# Patient Record
Sex: Male | Born: 1950 | Race: White | Hispanic: No | Smoking: Never smoker
Health system: Southern US, Community
[De-identification: ages and names within clinical notes are randomized; demographics above are authoritative.]

## PROBLEM LIST (undated history)

## (undated) DIAGNOSIS — K5790 Diverticulosis of intestine, part unspecified, without perforation or abscess without bleeding: Secondary | ICD-10-CM

## (undated) DIAGNOSIS — K432 Incisional hernia without obstruction or gangrene: Secondary | ICD-10-CM

## (undated) DIAGNOSIS — I10 Essential (primary) hypertension: Secondary | ICD-10-CM

## (undated) DIAGNOSIS — Z973 Presence of spectacles and contact lenses: Secondary | ICD-10-CM

## (undated) DIAGNOSIS — E785 Hyperlipidemia, unspecified: Secondary | ICD-10-CM

## (undated) DIAGNOSIS — R21 Rash and other nonspecific skin eruption: Secondary | ICD-10-CM

## (undated) DIAGNOSIS — M1990 Unspecified osteoarthritis, unspecified site: Secondary | ICD-10-CM

## (undated) DIAGNOSIS — K219 Gastro-esophageal reflux disease without esophagitis: Secondary | ICD-10-CM

## (undated) DIAGNOSIS — Z5189 Encounter for other specified aftercare: Secondary | ICD-10-CM

## (undated) DIAGNOSIS — J189 Pneumonia, unspecified organism: Secondary | ICD-10-CM

## (undated) DIAGNOSIS — K259 Gastric ulcer, unspecified as acute or chronic, without hemorrhage or perforation: Secondary | ICD-10-CM

## (undated) HISTORY — DX: Unspecified osteoarthritis, unspecified site: M19.90

## (undated) HISTORY — PX: COLONOSCOPY WITH ESOPHAGOGASTRODUODENOSCOPY (EGD): SHX5779

## (undated) HISTORY — DX: Hyperlipidemia, unspecified: E78.5

## (undated) HISTORY — DX: Essential (primary) hypertension: I10

## (undated) HISTORY — PX: SHOULDER SURGERY: SHX246

## (undated) HISTORY — PX: BACK SURGERY: SHX140

## (undated) HISTORY — DX: Encounter for other specified aftercare: Z51.89

---
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
## 1978-02-21 HISTORY — PX: COLON SURGERY: SHX602

## 1986-02-21 HISTORY — PX: HERNIA REPAIR: SHX51

## 2000-03-21 ENCOUNTER — Ambulatory Visit (HOSPITAL_COMMUNITY): Admission: RE | Admit: 2000-03-21 | Discharge: 2000-03-21 | Payer: Self-pay | Admitting: Neurosurgery

## 2000-03-21 ENCOUNTER — Encounter: Payer: Self-pay | Admitting: Neurosurgery

## 2000-04-12 ENCOUNTER — Encounter: Payer: Self-pay | Admitting: Neurosurgery

## 2000-04-12 ENCOUNTER — Ambulatory Visit (HOSPITAL_COMMUNITY): Admission: RE | Admit: 2000-04-12 | Discharge: 2000-04-13 | Payer: Self-pay | Admitting: Neurosurgery

## 2000-05-25 ENCOUNTER — Ambulatory Visit (HOSPITAL_COMMUNITY): Admission: RE | Admit: 2000-05-25 | Discharge: 2000-05-25 | Payer: Self-pay | Admitting: Neurosurgery

## 2000-05-25 ENCOUNTER — Encounter: Payer: Self-pay | Admitting: Neurosurgery

## 2000-07-07 ENCOUNTER — Ambulatory Visit (HOSPITAL_COMMUNITY): Admission: RE | Admit: 2000-07-07 | Discharge: 2000-07-07 | Payer: Self-pay | Admitting: Neurosurgery

## 2000-07-07 ENCOUNTER — Encounter: Payer: Self-pay | Admitting: Neurosurgery

## 2002-10-04 ENCOUNTER — Encounter: Payer: Self-pay | Admitting: Specialist

## 2002-10-04 ENCOUNTER — Encounter (INDEPENDENT_AMBULATORY_CARE_PROVIDER_SITE_OTHER): Payer: Self-pay | Admitting: Specialist

## 2002-10-04 ENCOUNTER — Observation Stay (HOSPITAL_COMMUNITY): Admission: RE | Admit: 2002-10-04 | Discharge: 2002-10-05 | Payer: Self-pay | Admitting: Specialist

## 2004-12-16 ENCOUNTER — Ambulatory Visit (HOSPITAL_COMMUNITY): Admission: RE | Admit: 2004-12-16 | Discharge: 2004-12-16 | Payer: Self-pay | Admitting: Gastroenterology

## 2006-03-22 ENCOUNTER — Encounter: Admission: RE | Admit: 2006-03-22 | Discharge: 2006-03-22 | Payer: Self-pay | Admitting: Specialist

## 2006-04-18 ENCOUNTER — Encounter: Admission: RE | Admit: 2006-04-18 | Discharge: 2006-04-18 | Payer: Self-pay | Admitting: Specialist

## 2006-07-20 ENCOUNTER — Inpatient Hospital Stay (HOSPITAL_COMMUNITY): Admission: RE | Admit: 2006-07-20 | Discharge: 2006-07-24 | Payer: Self-pay | Admitting: Specialist

## 2007-09-18 ENCOUNTER — Encounter: Admission: RE | Admit: 2007-09-18 | Discharge: 2007-09-18 | Payer: Self-pay | Admitting: General Surgery

## 2008-02-22 HISTORY — PX: HERNIA REPAIR: SHX51

## 2010-04-21 ENCOUNTER — Other Ambulatory Visit (HOSPITAL_BASED_OUTPATIENT_CLINIC_OR_DEPARTMENT_OTHER): Payer: Self-pay | Admitting: Family Medicine

## 2010-04-22 ENCOUNTER — Ambulatory Visit (HOSPITAL_BASED_OUTPATIENT_CLINIC_OR_DEPARTMENT_OTHER)
Admission: RE | Admit: 2010-04-22 | Discharge: 2010-04-22 | Disposition: A | Discharge status: Home / Self Care | Payer: 59 | Source: Ambulatory Visit | Attending: Family Medicine | Admitting: Family Medicine

## 2010-04-22 DIAGNOSIS — K5730 Diverticulosis of large intestine without perforation or abscess without bleeding: Secondary | ICD-10-CM | POA: Insufficient documentation

## 2010-04-22 DIAGNOSIS — K7689 Other specified diseases of liver: Secondary | ICD-10-CM | POA: Insufficient documentation

## 2010-04-22 DIAGNOSIS — R109 Unspecified abdominal pain: Secondary | ICD-10-CM

## 2010-06-03 ENCOUNTER — Encounter (HOSPITAL_COMMUNITY): Payer: 59

## 2010-06-03 ENCOUNTER — Other Ambulatory Visit (HOSPITAL_COMMUNITY): Payer: Self-pay | Admitting: Surgery

## 2010-06-03 ENCOUNTER — Ambulatory Visit (HOSPITAL_COMMUNITY)
Admission: RE | Admit: 2010-06-03 | Discharge: 2010-06-03 | Disposition: A | Discharge status: Home / Self Care | Payer: 59 | Source: Ambulatory Visit | Attending: Surgery | Admitting: Surgery

## 2010-06-03 ENCOUNTER — Other Ambulatory Visit: Payer: Self-pay | Admitting: Surgery

## 2010-06-03 DIAGNOSIS — Z01818 Encounter for other preprocedural examination: Secondary | ICD-10-CM | POA: Insufficient documentation

## 2010-06-03 DIAGNOSIS — I10 Essential (primary) hypertension: Secondary | ICD-10-CM

## 2010-06-03 DIAGNOSIS — Z79899 Other long term (current) drug therapy: Secondary | ICD-10-CM | POA: Insufficient documentation

## 2010-06-03 LAB — CBC
Hemoglobin: 14.6 g/dL (ref 13.0–17.0)
MCHC: 34 g/dL (ref 30.0–36.0)
MCV: 87 fL (ref 78.0–100.0)
Platelets: 251 10*3/uL (ref 150–400)
WBC: 4.3 10*3/uL (ref 4.0–10.5)

## 2010-06-03 LAB — BASIC METABOLIC PANEL
BUN: 20 mg/dL (ref 6–23)
CO2: 27 mEq/L (ref 19–32)
Calcium: 9.7 mg/dL (ref 8.4–10.5)
GFR calc non Af Amer: >60 mL/min (ref >60)
Glucose, Bld: 96 mg/dL (ref 70–99)
Sodium: 142 mEq/L (ref 135–145)

## 2010-06-03 LAB — SURGICAL PCR SCREEN: MRSA, PCR: NEGATIVE

## 2010-06-09 ENCOUNTER — Inpatient Hospital Stay (HOSPITAL_COMMUNITY)
Admission: RE | Admit: 2010-06-09 | Discharge: 2010-06-13 | DRG: 331 | Disposition: A | Discharge status: Home / Self Care | Payer: 59 | Source: Ambulatory Visit | Attending: Surgery | Admitting: Surgery

## 2010-06-09 ENCOUNTER — Other Ambulatory Visit: Payer: Self-pay | Admitting: Surgery

## 2010-06-09 DIAGNOSIS — D175 Benign lipomatous neoplasm of intra-abdominal organs: Principal | ICD-10-CM | POA: Diagnosis present

## 2010-06-09 LAB — TYPE AND SCREEN

## 2010-06-09 LAB — ABO/RH: ABO/RH(D): A POS

## 2010-06-10 LAB — BASIC METABOLIC PANEL
BUN: 15 mg/dL (ref 6–23)
GFR calc non Af Amer: >60 mL/min (ref >60)
Glucose, Bld: 81 mg/dL (ref 70–99)
Potassium: 4 mEq/L (ref 3.5–5.1)

## 2010-06-10 LAB — CBC
Hemoglobin: 12.1 g/dL — ABNORMAL LOW (ref 13.0–17.0)
MCH: 28.9 pg (ref 26.0–34.0)
MCHC: 33.2 g/dL (ref 30.0–36.0)
RBC: 4.19 MIL/uL — ABNORMAL LOW (ref 4.22–5.81)

## 2010-06-10 NOTE — Op Note (Signed)
Nathan Moon, Nathan Moon              ACCOUNT NO.:  [id number]  MEDICAL RECORD NO.:  [id number]           PATIENT TYPE:  I  LOCATION:  1529                         FACILITY:  [facility]  PHYSICIAN:  Abigail Miyamoto, M.D. DATE OF BIRTH:  12-10-1950  DATE OF PROCEDURE:  06/09/2010 DATE OF DISCHARGE:                              OPERATIVE REPORT   PREOPERATIVE DIAGNOSIS:  Colon mass.  POSTOPERATIVE DIAGNOSIS:  Colon mass.  PROCEDURE:  Laparoscopic-assisted partial sigmoid colectomy.  SURGEON:  Abigail Miyamoto, M.D.  ASSISTANT:  Lennie Muckle, MD  ANESTHESIA:  General endotracheal anesthesia.  ESTIMATED BLOOD LOSS:  Minimal.  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 Decision was made to proceed with partial colectomy.  Preoperatively, he had had a colonoscopy by Dr. Elnoria Howard and had the area tattooed.  FINDINGS:  This patient was indeed found to have a 3 cm to 4 cm mass in the sigmoid colon, which was easily identified with the tattooing from the colonoscopy, the mass felt consistent with a lipoma.  PROCEDURE IN DETAIL:  The patient was brought to the operating room, identified as Nathan Moon.  He was placed supine on the operating room table and general anesthesia was induced.  His abdomen was then prepped and draped in usual sterile fashion.  Using a #15 blade, a small vertical incision was made below the umbilicus.  This was carried down the fascia, which was opened with scalpel.  Hemostat was used to pass the peritoneal cavity.  Next, a 0 Vicryl pursestring suture was placed around the fascial opening.  The [facility] port was placed through the opening and insufflation of the abdomen was begun.  Circumferential inspection was then undertaken.  I placed a 5 mm port in the patient's right lower quadrant.  I was able to easily identify the sigmoid colon and rectum and found the area  of previous tattooing and could identify the mass in the lumen of the sigmoid colon.  I placed another 5 mm port in the patient's lower midline through the previous scar.  I then mobilized the sigmoid colon and descending colon along the white line of Toldt with the Endoshears electrocautery.  There were some attachments of the patient's diverticular disease to the pelvic sidewall but these came off easily.  I was able to stay right near the colon and mesentery away from the ureter.  I followed the colon all the way up to splenic flexure, but did not have to take down the splenic flexure.  There was a lot of length of the distal colon as well.  At this point, I decided to convert it to the open part of procedure.  I removed the 5 mm ports and made the patient's lower midline incision larger with a #10 blade.  I dissected down through the fascia with electrocautery.  The peritoneum was then opened entirely through this lower incision.  I was able to easily identify the loop of sigmoid colon and bring it up out of the incision.  I placed a wound protector around the  incision.  Once the colon was up and mobilized adequately, I transected the colon proximal distal to the intraluminal mass with GIA 75 staplers.  I then took down the mesentery with the EndoSeal stapler.  The specimen was sent to Pathology for evaluation.  At this point, I decided to do a hand-sewn end-to-end anastomosis.  I placed stay sutures on the 4 corners of the proximal and distal segments of colon.  I then opened up the staple lines with the electrocautery.  I first performed a back row of the anastomosis placing interrupted 3-0 silk sutures.  I then came around the corners and completed the anastomosis with interrupted 3-0 silk sutures on the anterior side of the intestines.  The anastomosis appeared pink, quite large, and without any evidence of tension.  It appeared well perfused.  At this point, there was a redundant  piece of epiploica and mesenteric fat which I sewed over the anastomosis as well. I then placed a bowel clamp proximal to the anastomosis.  I filled the pelvis with saline and then tested the anastomosis by instilling air through the anus.  Again, distention of the anastomosis appeared be achieved without any evidence of leak.  I then removed the bowel clamp relieving the air from the colon.  I then thoroughly irrigated the abdomen with several liters of normal saline.  Hemostasis appeared to be achieved.  At this point, I tied the suture closed after removing the port at the umbilicus closing the fascial defect there.  I then closed the patient's midline incision with a running #1 looped PDS suture.  The skin was then irrigated and closed with skin staples.  The patient tolerated the procedure well.  All counts were correct at the end of procedure.  The patient was then extubated in operating room and taken in stable condition to the recovery room.     Abigail Miyamoto, M.D.     DB/MEDQ  D:  06/09/2010  T:  06/10/2010  Job:  [id number]  Electronically Signed by Abigail Miyamoto M.D. on 06/10/2010 11:00:06 AM

## 2010-07-06 NOTE — Op Note (Signed)
NAMEMAHER, SHON              ACCOUNT NO.:  [id number]   MEDICAL RECORD NO.:  [id number]          PATIENT TYPE:  INP   LOCATION:  2899                         FACILITY:  [facility]   PHYSICIAN:  Jene Every, M.D.    DATE OF BIRTH:  05-04-1950   DATE OF PROCEDURE:  DATE OF DISCHARGE:                               OPERATIVE REPORT   PREOPERATIVE DIAGNOSES:  1. Spinal stenosis.  2. Herniated nucleus pulposus.  3. Recurrent degenerative disk disease.   POSTOPERATIVE DIAGNOSES:  1. Spinal stenosis.  2. Herniated nucleus pulposus.  3. Recurrent degenerative disk disease.   PROCEDURE PERFORMED:  1. Lumbar redo decompression bilaterally with bilateral      hemilaminotomies, lateral recess decompression and foraminotomies.  2. Transforaminal interbody fusion L5-S1 right utilizing a 9 PEEK      __________  cage.  3. Posterolateral fusion with instrumentation utilizing pedicle screw      instrumentation at L5-S1, facet screw instrumentation L5-S1 left.  4. Posterolateral fusion with autologous and autograft bone.  5. Intraoperative neural monitoring for 4 hours.   ANESTHESIA:  General.   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  Risks and  benefits discussed include bleeding, infection, damage to vascular  structures, CSF leakage, epidural fibrosis, __________, need for fusion  in the future, anesthetic complication, nonunion, et Karie Soda.   TECHNIQUE:  With the patient in the supine position, after the induction  of adequate general anesthesia and 2 grams of Kefzol, he was placed  prone on the Wilson frame Ripley table.  All bony prominences were well  padded.  The lumbar region is prepped and draped in the usual fashion.  A  surgical incision was made from the spinous process to the forward S1.  Subcutaneous tissue was dissected, electrocautery was utilized to  obtained hemostasis.  Dorsal lumbar fascia was identified, divided along  the skin incision.  Paraspinous muscle elevated from lamina L4-5 and S1,  partially at 4.  We spared the facet of 4-5 and decapsulated 5-1.  Self-  retaining retractor was placed to confirm the interspace with C-arm x-  ray, using an interlaminar distractor at 5-1.  First, a hemilaminotomy  and a partial facetectomy was performed at L5-S1 utilizing straight  osteotome and a rongeur.  We identified the pedicle of S1 and 5 and  removed the superior articulating process of S1, the inferior  articulating process of 5.  There was a severe stenosis noted at L5,  noted with compression of the root.  I used again the interspinous  process distractor to open up the foramen which seemed to help, which  unloaded that foramen.  I then decompressed the lateral recess at 5-1  after immobilizing the epidural fibrosis performing a hemilaminotomy in  the cephalad edge of S1 and the caudad edge of 5, removing the  ligamentum flavum, performing a foraminotomy of S1, decompressing  the S1  nerve root, mobilizing it medially, sparing and protecting the 5 and S1  roots.  We performed medial laminotomy at the disk space.  Copious  __________  and disk material is removed from the disk space with  straight and upbiting pituitary.  Then, we used a curet down angled and  up angled to curet the end plates.  The disk space was irrigated.  The  bone cancellous from the facetectomy and from the allograft bone was  packed into the interspace.  Prior to that, we dilated the disk space  with an 8 and 9-mm dilator.  This was confirmed with an x-ray in the AP  and lateral plane.  A full diskectomy was performed with pituitaries  prior to that.  Then, we inserted the 9 PEEK cage, which I felt that it  filled the  space satisfactory.  Prior to that, again, bone graft was  packed anterior to that.  The bone graft was placed in the PEEK cage and  was impacted into the disk space diagonally to the midline and across  that on AP and lateral plane.  I then packed bone graft on top of that.  The lamina retractor was then removed.  We compressed down upon that.  Hockey stick freer was then passed freely in the foramen of 5 and S1 and  found to dilate that well.   Next, we identified the pedicles of 5 and 1, the junction of the TP and  the lateral aspect of the facet, entered it with the high speed bur and  then a pedicle finger on x-ray in the appropriate convergence in the AP  and lateral plane measured to 45 mm, placed a pedicle probe in that, and  it was all bone in circumference.  We then inserted the 45-length  screws, 65 after the appropriate tapping with excellent purchase.  Satisfactory position on x-ray.  We placed one in the S1 pedicle as well  in a similar sequence.  We checked the pedicle in the lateral recess of  5 and S1.  The screw was found to be totally in the pedicle.  We tested  that with the neural monitoring and they were both over 35 milliamps.  The wound was copiously irrigated.  On the contralateral side, I spoke  with Dr. Shon Baton.  We decompressed the lateral recess utilizing  hemilaminotomies, cephalad edge of S1 and the caudad edge of 5 and then  passed a hockey stick in the foramen of S1 and 5, they were widely  patent.  There was no disk herniation here.  There was a hypertrophic  facet on this side, the approach felt that a facet screw fixation on the  contralateral side would provide excellent fixation to minimize further  dissection laterally.  We then decorticated out laterally with the ala  and the TP on the top of the facet and the pars and used the 40 Synthes  screw with a washer and fully threaded cortical.  We drilled across the facet into the pedicle and verified that  on our x-ray and then the  pedicle probed the drill hole and it was within bone in its entirety.  We inserted the screw with excellent purchase.  Prior to that, we  decorticated the facet and the center of the facet as well.  Excellent  purchase was noted with expression of hematoma at the curetted facet.  We then passed the hockey-stick probe and then at the foramen of 5  and  S1, they were still widely patent.  We tested the facet screw and it was  26 milliamps.  There was no nerve root activity throughout the case that  was of any significance.  Inspection revealed now no other CSF leakage  or active bleeding.  We packed the bone graft with autologous and  allograft into the lateral recess upon the ala and TP with copious  amounts.  In the AP and lateral plane, we had satisfactory fixation and  distraction of the disk space and of the foramen.   Next, we placed a Hemovac and brought it out through a lateral stab  wound in the skin, removed the self-retaining retractor, repaired the  dorsolumbar fascia with #1 Vicryl interrupted figure-of-eight suture.  Subcutaneous tissue reapproximated with 0 and 2-0 Vicryl subcuticular.  The skin was reapproximated with staples.  The wound was dressed  sterilely.  He was placed supine on the hospital bed, extubated without  difficulty, and transported to recovery room in satisfactory condition.   The patient tolerated the procedure well.  There were no complications.  The assistant was [employer].  Blood loss 500 mL.      Jene Every, M.D.  Electronically Signed     JB/MEDQ  D:  07/20/2006  T:  07/20/2006  Job:  [id number]

## 2010-07-06 NOTE — Op Note (Signed)
Nathan Moon, Nathan Moon              ACCOUNT NO.:  [id number]   MEDICAL RECORD NO.:  [id number]          PATIENT TYPE:  INP   LOCATION:  2899                         FACILITY:  [facility]   PHYSICIAN:  Jene Every, M.D.    DATE OF BIRTH:  03-28-1950   DATE OF PROCEDURE:  DATE OF DISCHARGE:                               OPERATIVE REPORT   Audio too short to transcribe (less than 5 seconds)      Jene Every, M.D.     Cordelia Pen  D:  07/20/2006  T:  07/20/2006  Job:  [id number]

## 2010-07-09 NOTE — Op Note (Signed)
NAME:  Nathan Moon, Nathan Moon                   ACCOUNT NO.:  [id number]   MEDICAL RECORD NO.:  [id number]                   PATIENT TYPE:  AMB   LOCATION:  DAY                                  [facility]ACILITY[facility]  [facility]   PHYSICIAN:  Jene Every, M.D.                 DATE OF BIRTH:  02/10/1951   DATE OF PROCEDURE:  10/04/2002  DATE OF DISCHARGE:                                 OPERATIVE REPORT   PREOPERATIVE DIAGNOSES:  1. Herniated nucleus pulposus.  2. Spinal stenosis L5-S1.   POSTOPERATIVE DIAGNOSES:  1. Herniated nucleus pulposus.  2. Spinal stenosis L5-S1.   PROCEDURE PERFORMED:  Lateral recessed decompression of L5-S1, foraminotomy  S1, microdiskectomy L5-S1.   ANESTHESIA:  General.   ASSISTANT:  Roma Schanz, P.A.   BRIEF HISTORY:  A 60 year old with right lower extremity radicular pain in  the S1 nerve root distribution secondary to extruded fragment L5-S1  refractory to conservative treatment including corticosteroid injections, PT  and anti-inflammatories. Operative intervention was indicated for  decompression of the S1 nerve root, bilateral recessed decompression,  microdiskectomy. The risks and benefits were discussed including bleeding,  infection, injury to neurovascular structures, CSF leakage, epidural  fibrosis, adjacent segment disease, etc.   TECHNIQUE:  The patient in the supine position and after an adequate level  of general anesthesia and 1 g of Kefzol placed prone on the Andrews frame  and all bony prominences well padded. The lumbar region was prepped and  draped in the usual sterile fashion. An 18 gauge spinal needle was utilized  to localize the L5-S1 interspace confirmed with x-ray. An incision was made  from the spinous process of 5 to S1, subcutaneous tissue was dissected,  electrocautery was utilized to achieve hemostasis. The dorsolumbar fascia  was identified and divided in line with the skin incision. The paraspinous  muscles were elevated  from the lamina of L5 and S1, McCullough retractor was  placed, confirmatory radiograph obtained. It showed the L5-S1 interlaminar  space.  The operating microscope draped and brought on the surgical field.   The ligamentum flavum detached from the cephalad edge of S1. This removed  from the interspace with a neural patty placed beneath the ligamentum flavum  protecting the neural elements at all times. No significant pressure from  the S1 nerve root. It was gently mobilized medially and although noted was a  complex vascular leash tethering the S1 nerve root over an extruded  fragment, this was divided and cauterized and gently mobilized medially. HNP  was noted to be extruded beneath the posterior longitudinal ligament into  the disk space. Annulotomy was performed and along the posterior  longitudinal ligament. A copious portion of disk material was removed from  the disk space and from the subannular space. This was further mobilized  with an Epstein and a pituitary for a disk removing all ruptured disk.  Foraminotomies at S1 were performed as well. Following diskectomy in the  decompression, we mobilized the S1 nerve root, it was no long tethered. I  placed a hockey stick probe and the 5 foramen was found to be patent. The  wound was copiously irrigated, bipolar electrocautery was utilized to  achieve hemostasis. The wound was copiously irrigated, disk space copiously  irrigated. Hockey stick probe placed and the foramen of L5-S1 found to be  widely patent. There was no CSF leakage or residual. Pressure upon the root,  there was at least 1 cm of excursion medial to the pedicle without  difficulty. The nerve root was found to be erythematous and edematous. Next,  removed the [facility] retractor, no evidence of paraspinous bleeding. The  dorsolumbar fascia repaired with #1 Vicryl interrupted figure-of-eight  sutures, subcutaneous tissue reapproximated with 2-0 Vicryl simple sutures,   skin reapproximated with 4-0 subcuticular Prolene. The wound was reinforced  with Steri-Strips, sterile dressing was applied. He was placed supine on the  hospital bed, extubated without difficulty, transported to the recovery room  in satisfactory condition.   The patient tolerated the procedure well with no complications.                                               Jene Every, M.D.    Cordelia Pen  D:  10/04/2002  T:  10/05/2002  Job:  [id number]

## 2010-07-09 NOTE — Op Note (Signed)
[place]  Patient:    Nathan Moon, Nathan Moon                     MRN: [id number] Proc. Date: 04/12/00 Adm. Date:  [id number] Attending:  Donalee Citrin P                           Operative Report  PREOPERATIVE DIAGNOSIS:  Right C6 radiculopathy.  PROCEDURE:  Anterior cervical diskectomy and fusion at C5-6 with 7 mm fibular allograft and 25 mm Atlantis plate with 13 mm screws.  SURGEON:  Garlon Hatchet., M.D.  ASSISTANT:  Julio Sicks, M.D.  ANESTHESIA:  General endotracheal.  ESTIMATED BLOOD LOSS:  Less than 100.  CLINICAL NOTE:  The patient is a very pleasant 60 year old gentleman.  He has had over a year of progressive worsening neck and right arm pain that radiates down to his thumb and first finger in a C6 distribution.  The patient had been refractory to all forms of conservative treatment, to include physical therapy, anti-inflammatories.  Preoperative imaging included a myelogram, which showed a nerve root cut-off at the right C6 nerve root as well as some disk and spur spur compression on the right C6 and the proximal aspect of the neural foramen.  The patient was counseled, and he had failed conservative treatment and was brought for anterior cervical diskectomy and fusion.  DESCRIPTION OF PROCEDURE:  The patient was brought into the OR, was induced under general anesthesia.  He was positioned supine with the head in extension in the traction with five pounds of neck traction.  A shoulder roll was placed.  The right side of the neck was prepped and draped in the usual sterile fashion.  A preoperative x-ray confirmed localization of a needle in th C5 vertebral body.  A curvilinear incision was made just inferior to this off the midline to the anterior border of the sternocleidomastoid. Superficially the platysma was dissected out.  The platysma was then divided longitudinally, and the avascular plane between the sternocleidomastoid and the  omohyoid was developed down to the prevertebral fascia.  The prevertebral fascia was teased away with the Kitners.  Intraoperative x-ray confirmed placement of a needle probe in the C4-5 interspace.  Attention was taken to the interspace.  Below this, the longus colli was reflected laterally.  A self-retaining retractor was placed.  Annulotomy was made with an 11 blade scalpel.  Pituitary rongeurs were used to remove the anterior part of the annulus at C5-6.  Then the [place] Black Max drill was used to drill off the end plates, and _____ down the posterior osteophyte.  Then using a 1 and 2 mm Kerrison punch under microscopic illumination, the remainder of the end plate and ligament was identified and removed in a piecemeal fashion.  There was a combination of spondylitic spur as well as ligamentous hypertrophy and disk bulge compressing the proximal aspect of the right C6 nerve root.  This was removed in piecemeal fashion, and the neural foramen was widely opened.  This was carried out distally, and the first approximately several millimeters of the right C6 nerve root was visualized, and then the remainder of the foramen was then probed with a nerve hook and was noted to be widely patent.  Then attention was taken into the left side.  At this point, the left C6 nerve root was explored and opened up.  The end plates were evened  off and prepared for arthrodesis.  A 7 mm fibular allograft was selected, and interbody inserter was placed.  It was inserted under compression.  The wound was copiously irrigated.  Meticulous hemostasis was maintained.  Then the vertebral bodies were prepared.  A 25 mm Atlantis plate was selected.  It was drilled, tapped, and a 13 mm screw was placed in the bodies of C5 and C6.  Postoperative x-ray confirmed good localization of the screws and plate as well as the bone graft. The wound was copiously irrigated, and meticulous hemostasis was maintained. The wound was  closed with 3-0 interrupted Vicryls in the platysma and running 4-0 subcuticular in the skin.  Benzoin and Steri-Strips were applied.  The patient went to the recovery room in stable condition.  At the end of the case, all needle counts and sponge counts were correct. DD:  04/12/00 TD:  04/12/00 Job: [id number]

## 2010-07-09 NOTE — Discharge Summary (Signed)
Nathan Moon, Nathan Moon              ACCOUNT NO.:  [id number]   MEDICAL RECORD NO.:  [id number]          PATIENT TYPE:  INP   LOCATION:  5036                         FACILITY:  [facility]   PHYSICIAN:  Jene Every, M.D.    DATE OF BIRTH:  04-20-1950   DATE OF ADMISSION:  07/20/2006  DATE OF DISCHARGE:  07/24/2006                               DISCHARGE SUMMARY   ADMISSION DIAGNOSES:  1. Spinal stenosis.  2. Degenerative disk disease L5, S1.  3. Hypertension.  4. Hypercholesterolemia.   DISCHARGE DIAGNOSES:  1. Spinal stenosis.  2. Degenerative disk disease L5, S1.  3. Hypertension.  4. Hypercholesterolemia.  5. Status post lumbar decompression, posterior lateral fusion      utilizing pedicle screw instrumentation.   CONSULTATIONS:  PT/OT, case management.   PROCEDURE:  The patient was taken to the OR on Jul 20, 2006 and  underwent re-do lumbar decompression bilaterally at L5, S1 with  transluminal interbody fusion and posterior lateral fusion with  instrumentation at L5, S1.   SURGEON:  Dr. Jene Every.   ASSISTANT:  Venita Lick.   ANESTHESIA:  General.   COMPLICATIONS:  None.   HISTORY:  Mr. Pilot is a pleasant 60 year old gentleman who has had  off and on back pain for quite some time.  He underwent a lumbar  decompression in 2004 and did quite well from this until January of 2008  when he noted recurrent right lower extremity symptoms with numbness  into the leg.  At that time, repeat MRI did show recurrent disk  herniation of L5, S1 with __________ of the disk, left greater than  right, with a biforaminal stenosis.  The patient was initially treated  conservatively with epidural  steroid injections which only gave him  short term relief.  Did proceed with a CT diskogram which showed  __________  in L5, S1, normal S4-5.  It was felt at this point that the  patient would benefit from a lumbar decompression with fusion due to the  disabling nature of his symptoms.   The risks and benefits of the surgery  were discussed with the patient by Dr. Shelle Iron, and he does elect to  proceed.   LABORATORY DATA:  Preoperative CBC shows a white blood count of 12.3,  hemoglobin 15.3, and a hematocrit of 44.3.  Hemoglobin and hematocrit  were monitored throughout the hospital course.  These remained stable.  At time of discharge, hemoglobin was 12.4, hematocrit 35.3.  Coagulation  studies done preoperatively within normal range.  Chemistries done  preoperatively show sodium 134, potassium 4.4, with normal glucose, BUN,  and creatinine.  These were followed throughout the hospital course, all  remained within normal range.  On discharge, sodium 137, potassium 4.6,  glucose of 108, BUN 13, creatinine 0.97.  Preoperative urinalysis was  negative.  Blood type is A+.  Preoperative EKG showed normal sinus  rhythm.  Normal EKG.  I again do not see a preoperative chest x-ray of  report listed in the chart.   HOSPITAL COURSE:  The patient was taken to the OR and underwent the  above stated  procedure without significant difficulty.  One HemoVac  drain was placed intraoperatively.  He was then transferred to the PACU  and then to the orthopedic floor for continued postoperative care.  He  was placed on PCA analgesics for pain relief.  Postoperative day #1, the  patient was doing fairly well.  He did note low back pain as expected.  Denied any lower extremity symptoms.  Pain was well-controlled on PCA  Dilaudid.  He did not note any flatus at that time.  The patient did  have a T-max of 100.4.  HemoVac was discontinued later that day.  We did  wean him off the PCA.  Incentive spirometer was encouraged.  He was  started on Dulcolax as well as Reglan.  PT/OT was consulted.  Postoperative day #2, the patient continued to do fairly well, but did  note pain with transition.  We did increase his Percocet to extra-  strength at the time as well as gave him IV Dilaudid as necessary.   He  had a bowel movement and was voiding without difficulty.  All laboratory  values remained within normal range.  The patient was slightly febrile,  and incentive spirometer again was encouraged.  Postoperative day #3,  the patient was doing much better.  He was controlled on p.o. analgesics  only.  He was afebrile.  Again, vital signs remained stable.  He was  ambulating without difficulty.  Postoperative day #4, the patient was  doing very well at this point and was felt to be stable to be discharged  home.  Again, he was voiding and having bowel movements without  difficulty.  He was afebrile.   DISPOSITION:  The patient discharged home with home health PT/OT as  necessary.  He should follow up with Dr. Shelle Iron in approximately 10 to 14  days for suture removal as well as x-rays.   ACTIVITY:  Ambulate as tolerated utilizing his brace.  We discussed back  precautions.   DIET:  High fiber.   DISCHARGE MEDICATIONS:  1. Percocet 7.5/325 one to two p.o. every 4 to 6 p.r.n. pain.  2. Vitamin C 500 mg daily.  3. Robaxin 500 mg one p.o. every 6 to 8 p.r.n. spasms.  4. Over-the-counter stool softener.   CONDITION ON DISCHARGE:  Stable.   FINAL DIAGNOSIS:  Doing well status post lumbar decompression and fusion  of L5, S1 utilizing pedicle screw instrumentation.      Roma Schanz, P.A.      Jene Every, M.D.  Electronically Signed   CS/MEDQ  D:  09/18/2006  T:  09/18/2006  Job:  [id number]

## 2010-07-09 NOTE — H&P (Signed)
NAMEFARREN, NELLES              ACCOUNT NO.:  [id number]   MEDICAL RECORD NO.:  [id number]           PATIENT TYPE:   LOCATION:                                 FACILITY:   PHYSICIAN:  Jene Every, M.D.    DATE OF BIRTH:  December 22, 1950   DATE OF ADMISSION:  07/20/2006  DATE OF DISCHARGE:                              HISTORY & PHYSICAL   CHIEF COMPLAINT:  Low back and bilateral lower extremity pain that  extends from the buttock all the way to the toes.   HISTORY:  Mr. Savas is a pleasant 60 year old gentleman who has had  off and on low back pain for quite a while.  He actually underwent a  lumbar decompression in 2004 and did quite well from that but in January  2008 he noted recurrent right lower extremity pain with some numbness  into the leg.  At that time MRI study was obtained which showed a  recurrent disc herniation at L5-S1 with caudal down turning of the disc  left greater than right with a bi-foraminal stenosis.  The patient was  initially treated conservatively with epidural steroid injections which  only gave him short term relief.  He had a persistent bilateral lower  extremity pain with development of disabling back pain.  At this time we  did proceed with a CT discogram which showed concordant pain at L5-S1  with normal L4-5.  It is felt at this point due to the disabling nature  of the patient's low back pain and lower extremity symptoms, he would  benefit from a lumbar decompression with fusion.  The risks and benefits  of this were discussed with the patient.  He does elect to proceed.   PAST MEDICAL HISTORY:  Significant for:  1. Hypertension.  2. Hypercholesterolemia.   CURRENT MEDICATIONS:  1. Lisinopril 20 mg 1 p.o. daily.  2. TriCor 145 mg 1 p.o. daily.  3. Lipitor 10 mg 1 p.o. daily.  4. Hydrocodone 5/500 mg 1 p.o. q 4-6 p.r.n. pain.  5. Levitra p.r.n.   ALLERGIES:  NONE.   PREVIOUS SURGICAL HISTORY:  Include appendectomy in 1967, removal of  adhesions in 1982, hernia repair in 1988, cervical fusion in 2002,  lumbar laminectomy in 2004 and rotator cuff repair in 2005.   FAMILY HISTORY:  Father had history of coronary artery disease,  hypertension, osteoarthritis.  Mother had history of diabetes as well as  arthritis.   SOCIAL HISTORY:  The patient is divorced.  He is a Geophysical data processor.  He denies any tobacco consumption.  He drinks occasional wine.  He has  two children.  The children will be his care givers following surgery.  He does live in a two story home.  Primary care physician is at [facility], Dr. Melven Sartorius.   REVIEW OF SYSTEMS:  GENERAL:  The patient denies any fever, chills,  night sweats or bleeding tendencies.  CNS:  No blurred or double vision,  seizures, headache or paralysis.  RESPIRATORY:  No shortness of breath,  productive cough or hemoptysis.  CARDIOVASCULAR:  No chest pain, angina  or orthopnea.  GU:  No dysuria or hematuria.  No discharge.  GI:  No  nausea, vomiting, diarrhea or constipation, melena or bloody stools.  MUSCULOSKELETAL:  As in HPI.   PHYSICAL EXAMINATION:  VITAL SIGNS:  Pulse 72, respirations 16, blood  pressure 136/92 in the right arm.  GENERAL:  This is a well-developed, well-nourished gentleman sitting  upright in no acute distress.  HEENT:  Atraumatic, normocephalic.  Pupils equal, round and reactive to  light.  EOMs intact.  NECK:  Supple.  No lymphadenopathy.  CHEST:  Clear to auscultation bilaterally.  No rhonchi, wheezes or  rales.  HEART:  Regular rate and rhythm without murmurs, rubs or gallops.  ABDOMEN:  Soft and nontender, non-distended.  Bowel sounds times four.  SKIN:  No rashes or lesions are noted.  EXTREMITIES:  The patient does have pain with forward flexion and  extension of the lumbar spine.  Straight leg raise bilaterally does  produce some low back and posterior thigh pain.  He does have moderate  EHL weakness.  Sensation is equal  bilaterally.  Pedal pulses are equal.   IMPRESSION:  Degenerative disc disease L5-S1.   PLAN:  The patient will be admitted to [facility] to undergo  TLIF, pedicle screw instrumentation, posterior lateral fusion L5-S1  using local and autologous bone graft      Roma Schanz, P.A.      Jene Every, M.D.  Electronically Signed    CS/MEDQ  D:  06/29/2006  T:  06/29/2006  Job:  [id number]

## 2010-07-13 ENCOUNTER — Encounter (INDEPENDENT_AMBULATORY_CARE_PROVIDER_SITE_OTHER): Payer: Self-pay | Admitting: Surgery

## 2010-12-09 LAB — BASIC METABOLIC PANEL
BUN: 11
CO2: 26
Calcium: 9.1
Chloride: 103
Chloride: 105
Creatinine, Ser: 0.97
GFR calc Af Amer: >60
GFR calc non Af Amer: >60
Glucose, Bld: 108 — ABNORMAL HIGH
Potassium: 4.6
Sodium: 136
Sodium: 137

## 2012-11-23 ENCOUNTER — Inpatient Hospital Stay (HOSPITAL_BASED_OUTPATIENT_CLINIC_OR_DEPARTMENT_OTHER): Payer: BC Managed Care – PPO

## 2012-11-23 ENCOUNTER — Inpatient Hospital Stay (HOSPITAL_BASED_OUTPATIENT_CLINIC_OR_DEPARTMENT_OTHER)
Admission: EM | Admit: 2012-11-23 | Discharge: 2012-11-24 | DRG: 316 | Disposition: A | Discharge status: Home / Self Care | Payer: BC Managed Care – PPO | Attending: Internal Medicine | Admitting: Internal Medicine

## 2012-11-23 ENCOUNTER — Encounter (HOSPITAL_BASED_OUTPATIENT_CLINIC_OR_DEPARTMENT_OTHER): Payer: Self-pay

## 2012-11-23 ENCOUNTER — Emergency Department (HOSPITAL_BASED_OUTPATIENT_CLINIC_OR_DEPARTMENT_OTHER): Payer: BC Managed Care – PPO

## 2012-11-23 DIAGNOSIS — I10 Essential (primary) hypertension: Secondary | ICD-10-CM | POA: Diagnosis present

## 2012-11-23 DIAGNOSIS — R55 Syncope and collapse: Secondary | ICD-10-CM | POA: Diagnosis present

## 2012-11-23 DIAGNOSIS — T46905A Adverse effect of unspecified agents primarily affecting the cardiovascular system, initial encounter: Secondary | ICD-10-CM | POA: Diagnosis present

## 2012-11-23 DIAGNOSIS — Z981 Arthrodesis status: Secondary | ICD-10-CM

## 2012-11-23 DIAGNOSIS — D582 Other hemoglobinopathies: Secondary | ICD-10-CM | POA: Diagnosis present

## 2012-11-23 DIAGNOSIS — E860 Dehydration: Secondary | ICD-10-CM | POA: Diagnosis present

## 2012-11-23 DIAGNOSIS — M129 Arthropathy, unspecified: Secondary | ICD-10-CM | POA: Diagnosis present

## 2012-11-23 DIAGNOSIS — E785 Hyperlipidemia, unspecified: Secondary | ICD-10-CM | POA: Diagnosis present

## 2012-11-23 DIAGNOSIS — N179 Acute kidney failure, unspecified: Secondary | ICD-10-CM | POA: Diagnosis present

## 2012-11-23 DIAGNOSIS — Z79899 Other long term (current) drug therapy: Secondary | ICD-10-CM

## 2012-11-23 LAB — COMPREHENSIVE METABOLIC PANEL
ALT: 30 U/L (ref 0–53)
Alkaline Phosphatase: 96 U/L (ref 39–117)
BUN: 85 mg/dL — ABNORMAL HIGH (ref 6–23)
CO2: 23 mEq/L (ref 19–32)
Calcium: 10.3 mg/dL (ref 8.4–10.5)
Chloride: 94 mEq/L — ABNORMAL LOW (ref 96–112)
GFR calc Af Amer: 13 mL/min — ABNORMAL LOW (ref >90)
GFR calc non Af Amer: 11 mL/min — ABNORMAL LOW (ref >90)
Glucose, Bld: 130 mg/dL — ABNORMAL HIGH (ref 70–99)
Potassium: 3.6 mEq/L (ref 3.5–5.1)
Sodium: 136 mEq/L (ref 135–145)
Total Bilirubin: 1 mg/dL (ref 0.3–1.2)

## 2012-11-23 LAB — CBC WITH DIFFERENTIAL/PLATELET
Basophils Absolute: 0 10*3/uL (ref 0.0–0.1)
Basophils Relative: 0 % (ref 0–1)
Eosinophils Relative: 3 % (ref 0–5)
HCT: 49.4 % (ref 39.0–52.0)
Hemoglobin: 17.5 g/dL — ABNORMAL HIGH (ref 13.0–17.0)
Lymphocytes Relative: 21 % (ref 12–46)
Lymphs Abs: 1.5 10*3/uL (ref 0.7–4.0)
MCHC: 35.4 g/dL (ref 30.0–36.0)
Monocytes Absolute: 0.9 10*3/uL (ref 0.1–1.0)
Neutro Abs: 4.5 10*3/uL (ref 1.7–7.7)
Neutrophils Relative %: 64 % (ref 43–77)
Platelets: 311 10*3/uL (ref 150–400)
RBC: 5.65 MIL/uL (ref 4.22–5.81)
RDW: 13.3 % (ref 11.5–15.5)
WBC: 7.1 10*3/uL (ref 4.0–10.5)

## 2012-11-23 LAB — URINALYSIS, ROUTINE W REFLEX MICROSCOPIC
Hgb urine dipstick: NEGATIVE
Nitrite: NEGATIVE
Protein, ur: NEGATIVE mg/dL
Urobilinogen, UA: 0.2 mg/dL (ref 0.0–1.0)

## 2012-11-23 LAB — OSMOLALITY: Osmolality: 313 mOsm/kg — ABNORMAL HIGH (ref 275–300)

## 2012-11-23 LAB — TROPONIN I
Troponin I: <0.3 ng/mL (ref <0.30)
Troponin I: <0.3 ng/mL (ref <0.30)

## 2012-11-23 MED ORDER — INFLUENZA VAC SPLIT QUAD 0.5 ML IM SUSP
0.5000 mL | INTRAMUSCULAR | Status: AC
  Administered 2012-11-24: 0.5 mL via INTRAMUSCULAR
  Filled 2012-11-23: qty 0.5

## 2012-11-23 MED ORDER — HEPARIN SODIUM (PORCINE) 5000 UNIT/ML IJ SOLN
5000.0000 [IU] | Freq: Three times a day (TID) | INTRAMUSCULAR | Status: DC
  Administered 2012-11-23 – 2012-11-24 (×2): 5000 [IU] via SUBCUTANEOUS
  Filled 2012-11-23 (×6): qty 1

## 2012-11-23 MED ORDER — MORPHINE SULFATE 2 MG/ML IJ SOLN: 1.0000 mg | INTRAMUSCULAR | Status: DC | PRN

## 2012-11-23 MED ORDER — SODIUM CHLORIDE 0.9 % IV SOLN: INTRAVENOUS | Status: DC

## 2012-11-23 MED ORDER — SODIUM CHLORIDE 0.9 % IJ SOLN: 3.0000 mL | Freq: Two times a day (BID) | INTRAMUSCULAR | Status: DC

## 2012-11-23 MED ORDER — ATORVASTATIN CALCIUM 40 MG PO TABS
40.0000 mg | ORAL_TABLET | Freq: Every day | ORAL | Status: DC
  Administered 2012-11-23: 40 mg via ORAL
  Filled 2012-11-23 (×2): qty 1

## 2012-11-23 MED ORDER — ACETAMINOPHEN 325 MG PO TABS
650.0000 mg | ORAL_TABLET | Freq: Four times a day (QID) | ORAL | Status: DC | PRN
  Administered 2012-11-24: 650 mg via ORAL
  Filled 2012-11-23: qty 2

## 2012-11-23 MED ORDER — TRAZODONE 25 MG HALF TABLET
25.0000 mg | ORAL_TABLET | Freq: Every evening | ORAL | Status: DC | PRN
  Filled 2012-11-23: qty 1

## 2012-11-23 MED ORDER — SODIUM CHLORIDE 0.9 % IV BOLUS (SEPSIS)
1000.0000 mL | Freq: Once | INTRAVENOUS | Status: AC
  Administered 2012-11-23: 1000 mL via INTRAVENOUS

## 2012-11-23 MED ORDER — HYDROCODONE-ACETAMINOPHEN 5-325 MG PO TABS: 1.0000 | ORAL_TABLET | ORAL | Status: DC | PRN

## 2012-11-23 MED ORDER — ACETAMINOPHEN 650 MG RE SUPP: 650.0000 mg | Freq: Four times a day (QID) | RECTAL | Status: DC | PRN

## 2012-11-23 MED ORDER — ONDANSETRON HCL 4 MG/2ML IJ SOLN: 4.0000 mg | Freq: Four times a day (QID) | INTRAMUSCULAR | Status: DC | PRN

## 2012-11-23 MED ORDER — ONDANSETRON HCL 4 MG PO TABS: 4.0000 mg | ORAL_TABLET | Freq: Four times a day (QID) | ORAL | Status: DC | PRN

## 2012-11-23 MED ORDER — ALUM & MAG HYDROXIDE-SIMETH 200-200-20 MG/5ML PO SUSP: 30.0000 mL | Freq: Four times a day (QID) | ORAL | Status: DC | PRN

## 2012-11-23 MED ORDER — SODIUM CHLORIDE 0.9 % IV SOLN
INTRAVENOUS | Status: DC
  Administered 2012-11-24: via INTRAVENOUS

## 2012-11-23 NOTE — ED Notes (Signed)
Patient transported to CT 

## 2012-11-23 NOTE — ED Notes (Signed)
Pt was advised of need for urine-was given urinal-given water after EDP approval-cont'd no urine-2nd cup water given

## 2012-11-23 NOTE — ED Notes (Signed)
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  He was evaluated by PMD PTA, had and EKG and sent to ED for evaluation of abnormal EKG in office.  Denies CP at present.

## 2012-11-23 NOTE — H&P (Signed)
[facility] History and Physical  Nathan Moon:[id number] DOB: [birth year] DOA: 11/23/2012  Referring physician: Dagmar Hait, MD PCP: Ethel Rana  Specialists:   Chief Complaint: Acute renal failure  HPI: Nathan Moon is a 62 y.o. male with past medical history of hypertension and arthritis. Patient came into the hospital because of syncope. Patient was in his usual state of health until 2 days ago when he was working and started to have some cramps and felt dizzy. He thought this is likely secondary to dehydration start drink more fluids, yesterday he had the same symptoms, but was worse to the point he collapsed and lost his consciousness for a brief period of time. Patient went home and today he went to his primary care provider and because of the syncope EKG was done to rule out MI and showed questionable Q waves so patient was sent to an appointment center for further evaluation. In the ED repeat 12-lead EKG and cardiac enzymes were negative for ischemia, but patient did have acute renal failure with creatinine of 5.0, no recent records but creatinine April 2012 was 0.9.  Review of Systems:  Constitutional: negative for anorexia, fevers and sweats Eyes: negative for irritation, redness and visual disturbance Ears, nose, mouth, throat, and face: negative for earaches, epistaxis, nasal congestion and sore throat Respiratory: negative for cough, dyspnea on exertion, sputum and wheezing Cardiovascular: negative for chest pain, dyspnea, lower extremity edema, orthopnea, palpitations and syncope Gastrointestinal: negative for abdominal pain, constipation, diarrhea, melena, nausea and vomiting Genitourinary:negative for dysuria, frequency and hematuria Hematologic/lymphatic: negative for bleeding, easy bruising and lymphadenopathy Musculoskeletal:negative for arthralgias, muscle weakness and stiff joints Neurological: negative for coordination problems, gait  problems, headaches and weakness Endocrine: negative for diabetic symptoms including polydipsia, polyuria and weight loss Allergic/Immunologic: negative for anaphylaxis, hay fever and urticaria  Past Medical History  Diagnosis Date  . Hyperlipidemia   . Hypertension   . Arthritis    Past Surgical History  Procedure Laterality Date  . Appendectomy  1968  . Colon surgery  1980    Intestinal Blockage  . Hernia repair  1988    Lt side  . Hernia repair  2010    Rt side  . Back surgery  2003,2006,2008    Lumbar Disectomy, Cervical Fusion, Lumbar Fusion   Social History:  reports that he has never smoked. He has never used smokeless tobacco. He reports that  drinks alcohol. He reports that he does not use illicit drugs. Works as a Insurance account manager  No Known Allergies  Family History  Problem Relation Age of Onset  . Diabetes Mother   . Arthritis Mother   . Heart disease Mother   . Vision loss Mother   . Arthritis Father   . Heart disease Father    Prior to Admission medications   Medication Sig Start Date End Date Taking? Authorizing Provider  Aspirin-Acetaminophen-Caffeine (EXCEDRIN PO) Take 2 tablets by mouth daily as needed (pain).   Yes Historical Provider, MD  atorvastatin (LIPITOR) 40 MG tablet Take 40 mg by mouth daily.    Yes Historical Provider, MD  ibuprofen (ADVIL,MOTRIN) 200 MG tablet Take 600 mg by mouth every 6 (six) hours as needed for pain.   Yes Historical Provider, MD  Lisinopril & Diet Manage Prod (LISINOPRIL-NUTRITIONAL SUPP) 20 MG MISC Take 20 mg by mouth at bedtime.    Yes Historical Provider, MD  zolpidem (AMBIEN CR) 12.5 MG CR tablet Take 10 mg by mouth at bedtime as  needed for sleep.    Yes Historical Provider, MD   Physical Exam: Filed Vitals:   11/23/12 1531  BP: 127/76  Pulse: 81  Temp:   Resp: 16   General appearance: alert, cooperative and no distress  Head: Normocephalic, without obvious abnormality, atraumatic  Eyes:  conjunctivae/corneas clear. PERRL, EOM's intact. Fundi benign.  Nose: Nares normal. Septum midline. Mucosa normal. No drainage or sinus tenderness.  Throat: lips, mucosa, and tongue normal; teeth and gums normal  Neck: Supple, no masses, no cervical lymphadenopathy, no JVD appreciated, no meningeal signs Resp: clear to auscultation bilaterally  Chest wall: no tenderness  Cardio: regular rate and rhythm, S1, S2 normal, no murmur, click, rub or gallop  GI: soft, non-tender; bowel sounds normal; no masses, no organomegaly  Extremities: extremities normal, atraumatic, no cyanosis or edema  Skin: Skin color, texture, turgor normal. No rashes or lesions  Neurologic: Alert and oriented X 3, normal strength and tone. Normal symmetric reflexes. Normal coordination and gait  Labs on Admission:  Basic Metabolic Panel:  Recent Labs Lab 11/23/12 1115  NA 136  K 3.6  CL 94*  CO2 23  GLUCOSE 130*  BUN 85*  CREATININE 5.00*  CALCIUM 10.3   Liver Function Tests:  Recent Labs Lab 11/23/12 1115  AST 35  ALT 30  ALKPHOS 96  BILITOT 1.0  PROT 8.2  ALBUMIN 5.0   No results found for this basename: LIPASE, AMYLASE,  in the last 168 hours No results found for this basename: AMMONIA,  in the last 168 hours CBC:  Recent Labs Lab 11/23/12 1115  WBC 7.1  NEUTROABS 4.5  HGB 17.5*  HCT 49.4  MCV 87.4  PLT 311   Cardiac Enzymes:  Recent Labs Lab 11/23/12 1115  TROPONINI <0.30    BNP (last 3 results) No results found for this basename: PROBNP,  in the last 8760 hours CBG: No results found for this basename: GLUCAP,  in the last 168 hours  Radiological Exams on Admission: Ct Abdomen Pelvis Wo Contrast  11/23/2012   CLINICAL DATA:  Loss of consciousness, dehydration and renal insufficiency.  EXAM: CT ABDOMEN AND PELVIS WITHOUT  TECHNIQUE: Multidetector CT imaging of the chest, abdomen and pelvis was performed following the standard protocol without IV contrast.  COMPARISON:   04/22/2010  FINDINGS: Two separate tiny nonobstructing calculi are present in the lower pole collecting system of the left kidney. These measure roughly 3 mm each in diameter. No hydronephrosis, ureteral calculi or bladder calculi are identified. Unenhanced appearance of the liver, spleen, pancreas and adrenal glands are within normal limits. The gallbladder contains 1 focal area of increased density likely representing at least 1 partially calcified gallstone. This was not definitely present on the prior CT. There are no signs by CT of gallbladder inflammation or biliary ductal dilatation.  Bowel shows normal caliber without evidence of obstruction. No acute inflammatory process is identified. There is no evidence of free fluid or abscess. Lower right ventral/inguinal hernia mesh is identified. There is a small left inguinal hernia containing fat. No masses or adenopathy are identified. The abdominal aorta shows sparse atherosclerotic plaque without evidence of aneurysm. Bony structures show advanced degenerative disease at L5-S1 with near complete loss of disc space height.  IMPRESSION: Nonobstructing calculi in the left lower kidney. No evidence of hydronephrosis.  Cholelithiasis with at least 1 partially calcified gallstone identified. There is no evidence of gallbladder inflammation or biliary obstruction.  Small left inguinal hernia containing fat.   Electronically Signed  By: Irish Lack M.D.   On: 11/23/2012 14:03   Dg Chest 2 View  11/23/2012   CLINICAL DATA:  Syncope today at work, dizziness, history hypertension  EXAM: CHEST  2 VIEW  COMPARISON:  06/03/2010  FINDINGS: Normal heart size, mediastinal contours, and pulmonary vascularity.  Scar at lateral left lung base stable.  No acute infiltrate, pleural effusion or pneumothorax.  Prior cervical spine fusion.  No acute osseous findings.  IMPRESSION: No acute abnormalities.   Electronically Signed   By: Ulyses Southward M.D.   On: 11/23/2012 12:27     EKG: Independently reviewed.   Assessment/Plan Principal Problem:   Acute renal failure Active Problems:   Dehydration   HTN (hypertension)   Elevated hemoglobin    Acute renal failure -Likely secondary to volume depletion from dehydration augmented by lisinopril administration. -FENa is 0.4% consistent with prerenal ARF. -CT scan of abdomen and pelvis showed small nonobstructing left-sided stones, no hydronephrosis. -Hold lisinopril. -Hydrate aggressively with IV fluids, strict intake and output. Patient still makes decent amount of urine.  Dehydration -As above, hydrate with IV fluids, close monitoring of intake and output.  Syncope -Likely secondary to dehydration/volume depletion. -Ambulates in a.m. after aggressive fluid hydration. Place on telemetry. -Recheck 12-lead EKG in a.m. and cycle 3 sets of cardiac enzymes to rule out ACS.  HTN -Hold lisinopril, followup closely. -Can probably be discharged on lower dose of lisinopril or either different medication for his HTN if he is susceptible to dehydration.  Elevated hemoglobin -Consistent with hemoconcentration. -Check CBC after IV fluid hydration, this expected to normalize.  Code Status: Full code Family Communication: Plan discussed with the patient Disposition Plan: Inpatient, telemetry, anticipate length of stay to be greater than 2 midnights  Time spent: 70 minutes  [facility] Pager [phone]  If 7PM-7AM, please contact night-coverage [email or web address] Password [facility] 11/23/2012, 5:31 PM

## 2012-11-23 NOTE — ED Notes (Signed)
Attempt to call report to [address] west, nurse not available 

## 2012-11-23 NOTE — ED Provider Notes (Addendum)
CSN: [id number]     Arrival date & time 11/23/12  1059 History   First MD Initiated Contact with Patient 11/23/12 1117     Chief Complaint  Patient presents with  . Loss of Consciousness   (Consider location/radiation/quality/duration/timing/severity/associated sxs/prior Treatment) Patient is a 62 y.o. male presenting with neurologic complaint. The history is provided by the patient.  Neurologic Problem This is a new problem. The current episode started yesterday. The problem occurs constantly. The problem has not changed since onset.Pertinent negatives include no chest pain, no abdominal pain, no headaches and no shortness of breath. Nothing aggravates the symptoms. Nothing relieves the symptoms. He has tried nothing for the symptoms.    Past Medical History  Diagnosis Date  . Hyperlipidemia   . Hypertension   . Arthritis    Past Surgical History  Procedure Laterality Date  . Appendectomy  1968  . Colon surgery  1980    Intestinal Blockage  . Hernia repair  1988    Lt side  . Hernia repair  2010    Rt side  . Back surgery  2003,2006,2008    Lumbar Disectomy, Cervical Fusion, Lumbar Fusion   Family History  Problem Relation Age of Onset  . Diabetes Mother   . Arthritis Mother   . Heart disease Mother   . Vision loss Mother   . Arthritis Father   . Heart disease Father    History  Substance Use Topics  . Smoking status: Never Smoker   . Smokeless tobacco: Never Used  . Alcohol Use: Yes     Comment: 2-3 glasses of wine a week    Review of Systems  Constitutional: Negative for fever.  Respiratory: Negative for cough and shortness of breath.   Cardiovascular: Negative for chest pain.  Gastrointestinal: Negative for abdominal pain.  Neurological: Positive for dizziness. Negative for headaches.  All other systems reviewed and are negative.    Allergies  Review of patient's allergies indicates no known allergies.  Home Medications   Current Outpatient Rx  Name   Route  Sig  Dispense  Refill  . atorvastatin (LIPITOR) 40 MG tablet   Oral   Take 40 mg by mouth daily.           . Lisinopril & Diet Manage Prod (LISINOPRIL-NUTRITIONAL SUPP) 20 MG MISC   Oral   Take by mouth.           . zolpidem (AMBIEN CR) 12.5 MG CR tablet   Oral   Take 10 mg by mouth at bedtime as needed.            BP 102/59  Pulse 76  Temp(Src) 97.7 F (36.5 C) (Oral)  Resp 18  Ht 6' (1.829 m)  Wt 203 lb (92.08 kg)  BMI 27.53 kg/m2  SpO2 100% Physical Exam  Nursing note and vitals reviewed. Constitutional: He is oriented to person, place, and time. He appears well-developed and well-nourished. No distress.  HENT:  Head: Normocephalic and atraumatic.  Mouth/Throat: No oropharyngeal exudate.  Eyes: EOM are normal. Pupils are equal, round, and reactive to light.  Neck: Normal range of motion. Neck supple.  Cardiovascular: Normal rate and regular rhythm.  Exam reveals no friction rub.   No murmur heard. Pulmonary/Chest: Effort normal and breath sounds normal. No respiratory distress. He has no wheezes. He has no rales.  Abdominal: He exhibits no distension. There is no tenderness. There is no rebound.  Musculoskeletal: Normal range of motion. He exhibits no edema.  Neurological: He is alert and oriented to person, place, and time.  Skin: He is not diaphoretic.    ED Course  Procedures (including critical care time) Labs Review Labs Reviewed  COMPREHENSIVE METABOLIC PANEL - Abnormal; Notable for the following:    Chloride 94 (*)    Glucose, Bld 130 (*)    BUN 85 (*)    Creatinine, Ser 5.00 (*)    GFR calc non Af Amer 11 (*)    GFR calc Af Amer 13 (*)    All other components within normal limits  CBC WITH DIFFERENTIAL - Abnormal; Notable for the following:    Hemoglobin 17.5 (*)    All other components within normal limits  TROPONIN I  OSMOLALITY  URINALYSIS, ROUTINE W REFLEX MICROSCOPIC  OSMOLALITY, URINE  SODIUM, URINE, RANDOM  CREATININE, URINE,  RANDOM   Imaging Review Dg Chest 2 View  11/23/2012   CLINICAL DATA:  Syncope today at work, dizziness, history hypertension  EXAM: CHEST  2 VIEW  COMPARISON:  06/03/2010  FINDINGS: Normal heart size, mediastinal contours, and pulmonary vascularity.  Scar at lateral left lung base stable.  No acute infiltrate, pleural effusion or pneumothorax.  Prior cervical spine fusion.  No acute osseous findings.  IMPRESSION: No acute abnormalities.   Electronically Signed   By: Ulyses Southward M.D.   On: 11/23/2012 12:27     Date: 11/23/2012  Rate: 85  Rhythm: normal sinus rhythm  QRS Axis: normal  Intervals: normal  ST/T Wave abnormalities: normal  Conduction Disutrbances:none  Narrative Interpretation:   Old EKG Reviewed: unchanged   MDM   1. Acute renal failure   2. Dehydration    15M presents s/p syncopal episode yesterday. Some associated dizziness over the past 2 days. 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 Felt thirsty despite drinking a lot of fluid. No preceding CP, SOB prior to passing out yesterday. Continued dizziness today. His PCP got an EKG which showed Q waves anteriorly and was concerned he'd had an MI yesterday. Here, initial EKG normal, no q waves. Troponin normal. Labs show ARF, likely secondary to dehydration as BUN is 85. Urine and serum labs sent. Will admit for fluids to [facility].  CT negative for possible stone causing renal failure.    Dagmar Hait, MD 11/23/12 1447

## 2012-11-23 NOTE — Progress Notes (Signed)
Valentina Shaggy, RN in ED to receive report.  RN said patient will be transported to [facility] via [facility].

## 2012-11-24 DIAGNOSIS — R55 Syncope and collapse: Secondary | ICD-10-CM

## 2012-11-24 LAB — BASIC METABOLIC PANEL
BUN: 34 mg/dL — ABNORMAL HIGH (ref 6–23)
CO2: 22 mEq/L (ref 19–32)
Calcium: 8.3 mg/dL — ABNORMAL LOW (ref 8.4–10.5)
Chloride: 107 mEq/L (ref 96–112)
Creatinine, Ser: 1.15 mg/dL (ref 0.50–1.35)
GFR calc Af Amer: 78 mL/min — ABNORMAL LOW (ref >90)
GFR calc non Af Amer: 43 mL/min — ABNORMAL LOW (ref >90)
GFR calc non Af Amer: 67 mL/min — ABNORMAL LOW (ref >90)
Glucose, Bld: 93 mg/dL (ref 70–99)
Glucose, Bld: 102 mg/dL — ABNORMAL HIGH (ref 70–99)
Potassium: 3.8 mEq/L (ref 3.5–5.1)
Potassium: 4.6 mEq/L (ref 3.5–5.1)
Sodium: 142 mEq/L (ref 135–145)

## 2012-11-24 LAB — CBC
HCT: 42.2 % (ref 39.0–52.0)
MCH: 31.9 pg (ref 26.0–34.0)
MCHC: 36 g/dL (ref 30.0–36.0)
MCV: 88.5 fL (ref 78.0–100.0)
Platelets: 208 10*3/uL (ref 150–400)
RDW: 13.2 % (ref 11.5–15.5)
WBC: 5 10*3/uL (ref 4.0–10.5)

## 2012-11-24 LAB — TSH: TSH: 1.206 u[IU]/mL (ref 0.350–4.500)

## 2012-11-24 LAB — TROPONIN I: Troponin I: <0.3 ng/mL (ref <0.30)

## 2012-11-24 LAB — HEMOGLOBIN A1C: Hgb A1c MFr Bld: 5.3 % (ref <5.7)

## 2012-11-24 MED ORDER — LISINOPRIL & DIET MANAGE PROD 20 MG PO MISC: 10.0000 mg | Freq: Every day | ORAL | Status: DC

## 2012-11-24 MED ORDER — LISINOPRIL 20 MG PO TABS: 10.0000 mg | ORAL_TABLET | Freq: Every day | ORAL | Status: DC

## 2012-11-24 NOTE — Progress Notes (Signed)
NURSING PROGRESS NOTE  Nathan Moon [id number] Discharge Data: 11/24/2012 1:54 PM Attending Provider: Clydia Llano, MD WUJ:[id number],NWGN, PA-C     Delmer Islam to be D/C'd Home per MD order.    All IV's discontinued with no bleeding noted.  All belongings returned to patient for patient to take home.   Last Vital Signs:  Blood pressure 113/71, pulse 71, temperature 97.7 F (36.5 C), temperature source Oral, resp. rate 20, height 6' (1.829 m), weight 92.08 kg (203 lb), SpO2 97.00%.  Discharge Medication List   Medication List    STOP taking these medications       ibuprofen 200 MG tablet  Commonly known as:  ADVIL,MOTRIN     LISINOPRIL-NUTRITIONAL SUPP 20 MG Misc      TAKE these medications       EXCEDRIN PO  Take 2 tablets by mouth daily as needed (pain).     LIPITOR 40 MG tablet  Generic drug:  atorvastatin  Take 40 mg by mouth daily.     lisinopril 20 MG tablet  Commonly known as:  PRINIVIL,ZESTRIL  Take 0.5 tablets (10 mg total) by mouth daily.     zolpidem 12.5 MG CR tablet  Commonly known as:  AMBIEN CR  Take 10 mg by mouth at bedtime as needed for sleep.        Madelin Rear, MSN, RN, [employer]

## 2012-11-24 NOTE — Progress Notes (Signed)
Physician Discharge Summary  MEHTAB DOLBERRY RUE:[id number] DOB: 1950-11-02 DOA: 11/23/2012  PCP: Ethel Rana  Admit date: 11/23/2012 Discharge date: 11/24/2012  Time spent: *50 minutes  Recommendations for Outpatient Follow-up:  1. *Follow up PCP in 2 weeks  Discharge Diagnoses:  Principal Problem:   Acute renal failure Active Problems:   Dehydration   HTN (hypertension)   Elevated hemoglobin   Syncope and collapse   Discharge Condition: Stable  Diet recommendation: Low salt diet  Filed Weights   11/23/12 1109  Weight: 92.08 kg (203 lb)    History of present illness:  *62 y.o. male with past medical history of hypertension and arthritis. Patient came into the hospital because of syncope. Patient was in his usual state of health until 2 days ago when he was working and started to have some cramps and felt dizzy. He thought this is likely secondary to dehydration start drink more fluids, yesterday he had the same symptoms, but was worse to the point he collapsed and lost his consciousness for a brief period of time. Patient went home and today he went to his primary care provider and because of the syncope EKG was done to rule out MI and showed questionable Q waves so patient was sent to an appointment center for further evaluation.  In the ED repeat 12-lead EKG and cardiac enzymes were negative for ischemia, but patient did have acute renal failure with creatinine of 5.0, no recent records but creatinine April 2012 was 0.9.   Hospital Course:  Acute renal failure  -Likely secondary to volume depletion from dehydration augmented by lisinopril administration.  -FENa is 0.4% consistent with prerenal ARF.  -CT scan of abdomen and pelvis showed small nonobstructing left-sided stones, no hydronephrosis.  - Lisinopril was held and he was given aggressive IV fluids - Cr has now come down  to baseline 1.16  Syncope  - Resolved -Likely secondary to dehydration/volume depletion.   - cardiac enzymes x 3 are negative   HTN  -Lisinopril was held in the hospital - Will start him back on Lisinopril at the lower dose of 10 mg po daily  Cholethiasis Patient has cholelithiasis seen on the CT scan with  No inflammation  Procedures:  None  Consultations:  *None  Discharge Exam: Filed Vitals:   11/24/12 0606  BP: 113/71  Pulse: 71  Temp: 97.7 F (36.5 C)  Resp: 20    General: *Appear in no acute distress Cardiovascular: *S1s2 RRR Respiratory: Clear bilaterally Ext: No edema  Discharge Instructions  Discharge Orders   Future Orders Complete By Expires   Diet - low sodium heart healthy  As directed    Increase activity slowly  As directed        Medication List    STOP taking these medications       ibuprofen 200 MG tablet  Commonly known as:  ADVIL,MOTRIN      TAKE these medications       EXCEDRIN PO  Take 2 tablets by mouth daily as needed (pain).     LIPITOR 40 MG tablet  Generic drug:  atorvastatin  Take 40 mg by mouth daily.     LISINOPRIL-NUTRITIONAL SUPP 20 MG Misc  Take 10 mg by mouth at bedtime.     zolpidem 12.5 MG CR tablet  Commonly known as:  AMBIEN CR  Take 10 mg by mouth at bedtime as needed for sleep.       No Known Allergies    The results of  significant diagnostics from this hospitalization (including imaging, microbiology, ancillary and laboratory) are listed below for reference.    Significant Diagnostic Studies: Ct Abdomen Pelvis Wo Contrast  11/23/2012   CLINICAL DATA:  Loss of consciousness, dehydration and renal insufficiency.  EXAM: CT ABDOMEN AND PELVIS WITHOUT  TECHNIQUE:  IMPRESSION: Nonobstructing calculi in the left lower kidney. No evidence of hydronephrosis.  Cholelithiasis with at least 1 partially calcified gallstone identified. There is no evidence of gallbladder inflammation or biliary obstruction.  Small left inguinal hernia containing fat.   Electronically Signed   By: Irish Lack M.D.    On: 11/23/2012 14:03   Dg Chest 2 View  11/23/2012   CLINICAL DATA:  Syncope today at work, dizziness, history hypertension  EXAM: CHEST  2 VIEW  COMPARISON:  06/03/2010  FINDINGS: Normal heart size, mediastinal contours, and pulmonary vascularity.  Scar at lateral left lung base stable.  No acute infiltrate, pleural effusion or pneumothorax.  Prior cervical spine fusion.  No acute osseous findings.  IMPRESSION: No acute abnormalities.   Electronically Signed   By: Ulyses Southward M.D.   On: 11/23/2012 12:27    Microbiology: No results found for this or any previous visit (from the past 240 hour(s)).   Labs: Basic Metabolic Panel:  Recent Labs Lab 11/23/12 1115 11/24/12 0242 11/24/12 0948  NA 136 142 141  K 3.6 3.8 4.6  CL 94* 107 107  CO2 23 22 26   GLUCOSE 130* 93 102*  BUN 85* 49* 34*  CREATININE 5.00* 1.66* 1.15  CALCIUM 10.3 8.3* 9.1   Liver Function Tests:  Recent Labs Lab 11/23/12 1115  AST 35  ALT 30  ALKPHOS 96  BILITOT 1.0  PROT 8.2  ALBUMIN 5.0   No results found for this basename: LIPASE, AMYLASE,  in the last 168 hours No results found for this basename: AMMONIA,  in the last 168 hours CBC:  Recent Labs Lab 11/23/12 1115 11/24/12 0242  WBC 7.1 5.0  NEUTROABS 4.5  --   HGB 17.5* 15.2  HCT 49.4 42.2  MCV 87.4 88.5  PLT 311 208   Cardiac Enzymes:  Recent Labs Lab 11/23/12 1115 11/23/12 2047 11/24/12 0235 11/24/12 0533  TROPONINI <0.30 <0.30 <0.30 <0.30   BNP: BNP (last 3 results) No results found for this basename: PROBNP,  in the last 8760 hours CBG: No results found for this basename: GLUCAP,  in the last 168 hours     Signed:  LAMA,GAGAN S  [facility] 11/24/2012, 11:26 AM

## 2012-11-26 LAB — OSMOLALITY, URINE: Osmolality, Ur: 444 mOsm/kg (ref 390–1090)

## 2012-12-05 NOTE — Discharge Summary (Signed)
Physician Discharge Summary    Nathan Moon:[id number] DOB: Nov 25, 1950 DOA: 11/23/2012   PCP: Ethel Rana   Admit date: 11/23/2012 Discharge date: 11/24/2012   Time spent: *50 minutes   Recommendations for Outpatient Follow-up:   *Follow up PCP in 2 weeks   Discharge Diagnoses:   Principal Problem:   Acute renal failure Active Problems:   Dehydration   HTN (hypertension)   Elevated hemoglobin   Syncope and collapse   Discharge Condition: Stable   Diet recommendation: Low salt diet    Filed Weights     11/23/12 1109   Weight:  92.08 kg (203 lb)      History of present illness:   *62 y.o. male with past medical history of hypertension and arthritis. Patient came into the hospital because of syncope. Patient was in his usual state of health until 2 days ago when he was working and started to have some cramps and felt dizzy. He thought this is likely secondary to dehydration start drink more fluids, yesterday he had the same symptoms, but was worse to the point he collapsed and lost his consciousness for a brief period of time. Patient went home and today he went to his primary care provider and because of the syncope EKG was done to rule out MI and showed questionable Q waves so patient was sent to an appointment center for further evaluation.   In the ED repeat 12-lead EKG and cardiac enzymes were negative for ischemia, but patient did have acute renal failure with creatinine of 5.0, no recent records but creatinine April 2012 was 0.9.     Hospital Course:  Acute renal failure   -Likely secondary to volume depletion from dehydration augmented by lisinopril administration.   -FENa is 0.4% consistent with prerenal ARF.   -CT scan of abdomen and pelvis showed small nonobstructing left-sided stones, no hydronephrosis.   - Lisinopril was held and he was given aggressive IV fluids - Cr has now come down  to baseline 1.16   Syncope   - Resolved -Likely  secondary to dehydration/volume depletion.   - cardiac enzymes x 3 are negative     HTN   -Lisinopril was held in the hospital - Will start him back on Lisinopril at the lower dose of 10 mg po daily   Cholethiasis Patient has cholelithiasis seen on the CT scan with  No inflammation   Procedures: None   Consultations: *None   Discharge Exam: Filed Vitals:     11/24/12 0606   BP:  113/71   Pulse:  71   Temp:  97.7 F (36.5 C)   Resp:  20      General: *Appear in no acute distress Cardiovascular: *S1s2 RRR Respiratory: Clear bilaterally Ext: No edema   Discharge Instructions    Discharge Orders     Future Orders  Complete By  Expires     Diet - low sodium heart healthy   As directed       Increase activity slowly   As directed            Medication List      STOP taking these medications           ibuprofen 200 MG tablet   Commonly known as:  ADVIL,MOTRIN          TAKE these medications           EXCEDRIN PO   Take 2 tablets by mouth daily as needed (pain).  LIPITOR 40 MG tablet   Generic drug:  atorvastatin   Take 40 mg by mouth daily.         LISINOPRIL-NUTRITIONAL SUPP 20 MG Misc   Take 10 mg by mouth at bedtime.         zolpidem 12.5 MG CR tablet   Commonly known as:  AMBIEN CR   Take 10 mg by mouth at bedtime as needed for sleep.           No Known Allergies    --------------------------------------------------------------------------------   The results of significant diagnostics from this hospitalization (including imaging, microbiology, ancillary and laboratory) are listed below for reference.       Significant Diagnostic Studies: Ct Abdomen Pelvis Wo Contrast   11/23/2012   CLINICAL DATA:  Loss of consciousness, dehydration and renal insufficiency.  EXAM: CT ABDOMEN AND PELVIS WITHOUT  TECHNIQUE:  IMPRESSION: Nonobstructing calculi in the left lower kidney. No evidence of hydronephrosis.  Cholelithiasis with at  least 1 partially calcified gallstone identified. There is no evidence of gallbladder inflammation or biliary obstruction.  Small left inguinal hernia containing fat.   Electronically Signed   By: Irish Lack M.D.   On: 11/23/2012 14:03    Dg Chest 2 View   11/23/2012   CLINICAL DATA:  Syncope today at work, dizziness, history hypertension  EXAM: CHEST  2 VIEW  COMPARISON:  06/03/2010  FINDINGS: Normal heart size, mediastinal contours, and pulmonary vascularity.  Scar at lateral left lung base stable.  No acute infiltrate, pleural effusion or pneumothorax.  Prior cervical spine fusion.  No acute osseous findings.  IMPRESSION: No acute abnormalities.   Electronically Signed   By: Ulyses Southward M.D.   On: 11/23/2012 12:27      Microbiology: No results found for this or any previous visit (from the past 240 hour(s)).    Labs: Basic Metabolic Panel: Recent Labs Lab  11/23/12 1115  11/24/12 0242  11/24/12 0948   NA  136  142  141   K  3.6  3.8  4.6   CL  94*  107  107   CO2  23  22  26    GLUCOSE  130*  93  102*   BUN  85*  49*  34*   CREATININE  5.00*  1.66*  1.15   CALCIUM  10.3  8.3*  9.1    Liver Function Tests: Recent Labs Lab  11/23/12 1115   AST  35   ALT  30   ALKPHOS  96   BILITOT  1.0   PROT  8.2   ALBUMIN  5.0    No results found for this basename: LIPASE, AMYLASE,  in the last 168 hours No results found for this basename: AMMONIA,  in the last 168 hours CBC: Recent Labs Lab  11/23/12 1115  11/24/12 0242   WBC  7.1  5.0   NEUTROABS  4.5   --    HGB  17.5*  15.2   HCT  49.4  42.2   MCV  87.4  88.5   PLT  311  208    Cardiac Enzymes: Recent Labs Lab  11/23/12 1115  11/23/12 2047  11/24/12 0235  11/24/12 0533   TROPONINI  <0.30  <0.30  <0.30  <0.30    BNP: BNP (last 3 results) No results found for this basename: PROBNP,  in the last 8760 hours CBG: No results found for this basename: GLUCAP,  in the last 168  hours  SignedMauro Kaufmann S           [facility] 11/24/2012, 11:26 AM

## 2013-08-05 ENCOUNTER — Emergency Department (HOSPITAL_BASED_OUTPATIENT_CLINIC_OR_DEPARTMENT_OTHER): Payer: BC Managed Care – PPO

## 2013-08-05 ENCOUNTER — Emergency Department (HOSPITAL_BASED_OUTPATIENT_CLINIC_OR_DEPARTMENT_OTHER)
Admission: EM | Admit: 2013-08-05 | Discharge: 2013-08-05 | Disposition: A | Discharge status: Home / Self Care | Payer: BC Managed Care – PPO | Attending: Emergency Medicine | Admitting: Emergency Medicine

## 2013-08-05 ENCOUNTER — Encounter (HOSPITAL_BASED_OUTPATIENT_CLINIC_OR_DEPARTMENT_OTHER): Payer: Self-pay | Admitting: Emergency Medicine

## 2013-08-05 ENCOUNTER — Other Ambulatory Visit: Payer: Self-pay

## 2013-08-05 DIAGNOSIS — Z8711 Personal history of peptic ulcer disease: Secondary | ICD-10-CM | POA: Insufficient documentation

## 2013-08-05 DIAGNOSIS — R112 Nausea with vomiting, unspecified: Secondary | ICD-10-CM | POA: Insufficient documentation

## 2013-08-05 DIAGNOSIS — Z79899 Other long term (current) drug therapy: Secondary | ICD-10-CM | POA: Insufficient documentation

## 2013-08-05 DIAGNOSIS — M129 Arthropathy, unspecified: Secondary | ICD-10-CM | POA: Insufficient documentation

## 2013-08-05 DIAGNOSIS — E785 Hyperlipidemia, unspecified: Secondary | ICD-10-CM | POA: Insufficient documentation

## 2013-08-05 DIAGNOSIS — R42 Dizziness and giddiness: Secondary | ICD-10-CM

## 2013-08-05 DIAGNOSIS — K8020 Calculus of gallbladder without cholecystitis without obstruction: Secondary | ICD-10-CM

## 2013-08-05 DIAGNOSIS — Z9889 Other specified postprocedural states: Secondary | ICD-10-CM | POA: Insufficient documentation

## 2013-08-05 DIAGNOSIS — K8050 Calculus of bile duct without cholangitis or cholecystitis without obstruction: Secondary | ICD-10-CM

## 2013-08-05 DIAGNOSIS — Z9089 Acquired absence of other organs: Secondary | ICD-10-CM | POA: Insufficient documentation

## 2013-08-05 DIAGNOSIS — I10 Essential (primary) hypertension: Secondary | ICD-10-CM | POA: Insufficient documentation

## 2013-08-05 HISTORY — DX: Gastric ulcer, unspecified as acute or chronic, without hemorrhage or perforation: K25.9

## 2013-08-05 LAB — COMPREHENSIVE METABOLIC PANEL
ALT: 26 U/L (ref 0–53)
AST: 26 U/L (ref 0–37)
Albumin: 4.7 g/dL (ref 3.5–5.2)
Alkaline Phosphatase: 64 U/L (ref 39–117)
BUN: 17 mg/dL (ref 6–23)
CALCIUM: 9.9 mg/dL (ref 8.4–10.5)
CO2: 23 meq/L (ref 19–32)
Chloride: 105 mEq/L (ref 96–112)
Creatinine, Ser: 0.8 mg/dL (ref 0.50–1.35)
GLUCOSE: 111 mg/dL — AB (ref 70–99)
Potassium: 3.9 mEq/L (ref 3.7–5.3)
SODIUM: 142 meq/L (ref 137–147)
Total Bilirubin: 0.5 mg/dL (ref 0.3–1.2)
Total Protein: 7.5 g/dL (ref 6.0–8.3)

## 2013-08-05 LAB — CBC
HEMATOCRIT: 44.4 % (ref 39.0–52.0)
HEMOGLOBIN: 15.8 g/dL (ref 13.0–17.0)
MCH: 31.2 pg (ref 26.0–34.0)
MCHC: 35.6 g/dL (ref 30.0–36.0)
MCV: 87.6 fL (ref 78.0–100.0)
Platelets: 246 10*3/uL (ref 150–400)
RBC: 5.07 MIL/uL (ref 4.22–5.81)
RDW: 12.7 % (ref 11.5–15.5)
WBC: 4.4 10*3/uL (ref 4.0–10.5)

## 2013-08-05 LAB — TROPONIN I: Troponin I: <0.3 ng/mL (ref <0.30)

## 2013-08-05 LAB — OCCULT BLOOD X 1 CARD TO LAB, STOOL: Fecal Occult Bld: NEGATIVE

## 2013-08-05 LAB — LIPASE, BLOOD: LIPASE: 90 U/L — AB (ref 11–59)

## 2013-08-05 MED ORDER — ONDANSETRON HCL 4 MG/2ML IJ SOLN
4.0000 mg | Freq: Once | INTRAMUSCULAR | Status: AC
  Administered 2013-08-05: 4 mg via INTRAVENOUS
  Filled 2013-08-05: qty 2

## 2013-08-05 MED ORDER — HYDROCODONE-ACETAMINOPHEN 5-325 MG PO TABS: 1.0000 | ORAL_TABLET | Freq: Four times a day (QID) | ORAL | Status: DC | PRN

## 2013-08-05 MED ORDER — MORPHINE SULFATE 4 MG/ML IJ SOLN
4.0000 mg | Freq: Once | INTRAMUSCULAR | Status: AC
  Administered 2013-08-05: 4 mg via INTRAVENOUS
  Filled 2013-08-05: qty 1

## 2013-08-05 MED ORDER — SODIUM CHLORIDE 0.9 % IV BOLUS (SEPSIS)
1000.0000 mL | Freq: Once | INTRAVENOUS | Status: AC
  Administered 2013-08-05: 1000 mL via INTRAVENOUS

## 2013-08-05 MED ORDER — GI COCKTAIL ~~LOC~~
30.0000 mL | Freq: Once | ORAL | Status: AC
  Administered 2013-08-05: 30 mL via ORAL
  Filled 2013-08-05: qty 30

## 2013-08-05 NOTE — ED Notes (Addendum)
Pt amb around ed with nurse, states his pain has now returned, rates at 7/10.

## 2013-08-05 NOTE — ED Notes (Signed)
Pt states that he does not have a ride home. Pt verbalizes understanding that he cannot drive a vehicle until at least 4 hours after receiving pain medication at 0945.

## 2013-08-05 NOTE — ED Provider Notes (Signed)
CSN: [id number]     Arrival date & time 08/05/13  0802 History   First MD Initiated Contact with Patient 08/05/13 [phone]     Chief Complaint  Patient presents with  . Abdominal Pain     (Consider location/radiation/quality/duration/timing/severity/associated sxs/prior Treatment) Patient is a 63 y.o. male presenting with abdominal pain. The history is provided by the patient.  Abdominal Pain Pain location:  RUQ Pain quality: sharp   Pain radiates to:  Does not radiate Pain severity:  Moderate Onset quality:  Gradual Duration:  8 weeks Timing:  Intermittent (5-6 times per day) Progression:  Worsening Chronicity:  New Context: recent illness (recent GI bleed, peptic ulcer, requiring blood transfusion)   Context: not sick contacts   Relieved by:  Nothing Worsened by:  Nothing tried Associated symptoms: nausea and vomiting (occasional)   Associated symptoms: no chest pain, no cough, no fever and no shortness of breath     Past Medical History  Diagnosis Date  . Hyperlipidemia   . Hypertension   . Arthritis   . Gastric ulcer    Past Surgical History  Procedure Laterality Date  . Appendectomy  1968  . Colon surgery  1980    Intestinal Blockage  . Hernia repair  1988    Lt side  . Hernia repair  2010    Rt side  . Back surgery  2003,2006,2008    Lumbar Disectomy, Cervical Fusion, Lumbar Fusion   Family History  Problem Relation Age of Onset  . Diabetes Mother   . Arthritis Mother   . Heart disease Mother   . Vision loss Mother   . Arthritis Father   . Heart disease Father    History  Substance Use Topics  . Smoking status: Never Smoker   . Smokeless tobacco: Never Used  . Alcohol Use: Yes     Comment: 2-3 glasses of wine a week    Review of Systems  Constitutional: Negative for fever.  Respiratory: Negative for cough and shortness of breath.   Cardiovascular: Negative for chest pain and leg swelling.  Gastrointestinal: Positive for nausea, vomiting  (occasional) and abdominal pain.  Neurological: Dizziness: wooziness, not related to RUQ pain. Associated near-sycnope yesterday. Dizzy at rest. Worse with getting up too fast.  All other systems reviewed and are negative.     Allergies  Review of patient's allergies indicates no known allergies.  Home Medications   Prior to Admission medications   Medication Sig Start Date End Date Taking? Authorizing Provider  ferrous fumarate (HEMOCYTE - 106 MG FE) 325 (106 FE) MG TABS tablet Take 1 tablet by mouth.   Yes Historical Provider, MD  Aspirin-Acetaminophen-Caffeine (EXCEDRIN PO) Take 2 tablets by mouth daily as needed (pain).    Historical Provider, MD  atorvastatin (LIPITOR) 40 MG tablet Take 40 mg by mouth daily.     Historical Provider, MD  lisinopril (PRINIVIL,ZESTRIL) 20 MG tablet Take 0.5 tablets (10 mg total) by mouth daily. 11/24/12   Oswald Hillock, MD  zolpidem (AMBIEN CR) 12.5 MG CR tablet Take 10 mg by mouth at bedtime as needed for sleep.     Historical Provider, MD   BP 166/100  Pulse 70  Temp(Src) 97.8 F (36.6 C) (Oral)  Resp 14  Ht 5\' 11"  (1.803 m)  Wt 208 lb (94.348 kg)  BMI 29.02 kg/m2  SpO2 97% Physical Exam  Nursing note and vitals reviewed. Constitutional: He is oriented to person, place, and time. He appears well-developed and well-nourished. No distress.  HENT:  Head: Normocephalic and atraumatic.  Mouth/Throat: No oropharyngeal exudate.  Eyes: EOM are normal. Pupils are equal, round, and reactive to light.  Neck: Normal range of motion. Neck supple.  Cardiovascular: Normal rate and regular rhythm.  Exam reveals no friction rub.   No murmur heard. Pulmonary/Chest: Effort normal and breath sounds normal. No respiratory distress. He has no wheezes. He has no rales.  Abdominal: He exhibits no distension. There is tenderness (mild, RUQ). There is no rebound and no guarding.  Genitourinary:  Dark brown stool, no gross blood  Musculoskeletal: Normal range of  motion. He exhibits no edema.  Neurological: He is alert and oriented to person, place, and time.  Skin: He is not diaphoretic.    ED Course  Procedures (including critical care time) Labs Review Labs Reviewed  CBC  COMPREHENSIVE METABOLIC PANEL  LIPASE, BLOOD  OCCULT BLOOD X 1 CARD TO LAB, STOOL    Imaging Review No results found.   EKG Interpretation None      Date: 08/05/2013  Rate: 83  Rhythm: normal sinus rhythm  QRS Axis: normal  Intervals: normal  ST/T Wave abnormalities: normal  Conduction Disutrbances:none  Narrative Interpretation:   Old EKG Reviewed: unchanged  MDM   Final diagnoses:  Dizziness  Gallstones    33M with hx of recent UGIB with peptic ulcers requiring blood transfusion presents with multiple complaints. RUQ pain - intermittent for past 2 months. Described as colicky, no radiation. Associated nausea without vomiting. No fevers. Hx of this pain before. On exam, mild RUQ pain, no guarding or rebound. Will obtain US. Dark stools - black stools past few days, is on iron supplements, but hasn't had difficulty with his stools being black on them.  Dizziness - going on a for a long time, intermittent, woozy feeling. Was driving yesterday and had near-syncopal event. Felt this way when he had his recent UGIB. 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 Dizziness worse with getting up too fast. No difficulty with gait, no neurologic deficit. Could be due to anemia vs exhaustion from work.   Lab work ok. Hgb ok. Hemoccult negative. Review of old records shows admission for syncope, dehydration before. He states he's passed out numerous times before. He also states some balance issues from decreased feeling in his toes (prior lumbar fusion to relieve this), knee and hip issues.  Ambulating well. RUQ US shows some wall thickening and gallstones, no white count, no increase in bili or LFTs. Mild bump in lipase. Clinical  picture not consistent with acute pancreatitis. Will give general surgery f/u.  Patient stable for discharge. Instructed to f/u with PCP for his 2 months of dizziness, remain hydrated, decreased workload if able.   Osvaldo Shipper, MD 08/05/13 [phone]

## 2013-08-05 NOTE — ED Notes (Signed)
Pt amb to room 2 with quick steady gait in nad. Pt reports ruq abd pain x 2 weeks with some nausea and "dizzy spells"-his usual gallbladder episode pain.

## 2013-08-05 NOTE — Discharge Instructions (Signed)
Biliary Colic  °Biliary colic is a steady or irregular pain in the upper abdomen. It is usually under the right side of the rib cage. It happens when gallstones interfere with the normal flow of bile from the gallbladder. Bile is a liquid that helps to digest fats. Bile is made in the liver and stored in the gallbladder. When you eat a meal, bile passes from the gallbladder through the cystic duct and the common bile duct into the small intestine. There, it mixes with partially digested food. If a gallstone blocks either of these ducts, the normal flow of bile is blocked. The muscle cells in the bile duct contract forcefully to try to move the stone. This causes the pain of biliary colic.  °SYMPTOMS  °· A person with biliary colic usually complains of pain in the upper abdomen. This pain can be: °· In the center of the upper abdomen just below the breastbone. °· In the upper-right part of the abdomen, near the gallbladder and liver. °· Spread back toward the right shoulder blade. °· Nausea and vomiting. °· The pain usually occurs after eating. °· Biliary colic is usually triggered by the digestive system's demand for bile. The demand for bile is high after fatty meals. Symptoms can also occur when a person who has been fasting suddenly eats a very large meal. Most episodes of biliary colic pass after 1 to 5 hours. After the most intense pain passes, your abdomen may continue to ache mildly for about 24 hours. °DIAGNOSIS  °After you describe your symptoms, your caregiver will perform a physical exam. He or she will pay attention to the upper right portion of your belly (abdomen). This is the area of your liver and gallbladder. An ultrasound will help your caregiver look for gallstones. Specialized scans of the gallbladder may also be done. Blood tests may be done, especially if you have fever or if your pain persists. °PREVENTION  °Biliary colic can be prevented by controlling the risk factors for gallstones. Some of  these risk factors, such as heredity, increasing age, and pregnancy are a normal part of life. Obesity and a high-fat diet are risk factors you can change through a healthy lifestyle. Women going through menopause who take hormone replacement therapy (estrogen) are also more likely to develop biliary colic. °TREATMENT  °· Pain medication may be prescribed. °· You may be encouraged to eat a fat-free diet. °· If the first episode of biliary colic is severe, or episodes of colic keep retuning, surgery to remove the gallbladder (cholecystectomy) is usually recommended. This procedure can be done through small incisions using an instrument called a laparoscope. The procedure often requires a brief stay in the hospital. Some people can leave the hospital the same day. It is the most widely used treatment in people troubled by painful gallstones. It is effective and safe, with no complications in more than 90% of cases. °· If surgery cannot be done, medication that dissolves gallstones may be used. This medication is expensive and can take months or years to work. Only small stones will dissolve. °· Rarely, medication to dissolve gallstones is combined with a procedure called shock-wave lithotripsy. This procedure uses carefully aimed shock waves to break up gallstones. In many people treated with this procedure, gallstones form again within a few years. °PROGNOSIS  °If gallstones block your cystic duct or common bile duct, you are at risk for repeated episodes of biliary colic. There is also a 25% chance that you will develop   a gallbladder infection(acute cholecystitis), or some other complication of gallstones within 10 to 20 years. If you have surgery, schedule it at a time that is convenient for you and at a time when you are not sick. HOME CARE INSTRUCTIONS   Drink plenty of clear fluids.  Avoid fatty, greasy or fried foods, or any foods that make your pain worse.  Take medications as directed. SEEK MEDICAL  CARE IF:   You develop a fever over 100.5 F (38.1 C).  Your pain gets worse over time.  You develop nausea that prevents you from eating and drinking.  You develop vomiting. SEEK IMMEDIATE MEDICAL CARE IF:   You have continuous or severe belly (abdominal) pain which is not relieved with medications.  You develop nausea and vomiting which is not relieved with medications.  You have symptoms of biliary colic and you suddenly develop a fever and shaking chills. This may signal cholecystitis. Call your caregiver immediately.  You develop a yellow color to your skin or the white part of your eyes (jaundice). Document Released: 07/11/2005 Document Revised: 05/02/2011 Document Reviewed: 09/20/2007 [facility] Patient Information 2014 [place].  Dizziness Dizziness is a common problem. It is a feeling of unsteadiness or lightheadedness. You may feel like you are about to faint. Dizziness can lead to injury if you stumble or fall. A person of any age group can suffer from dizziness, but dizziness is more common in older adults. CAUSES  Dizziness can be caused by many different things, including:  Middle ear problems.  Standing for too long.  Infections.  An allergic reaction.  Aging.  An emotional response to something, such as the sight of blood.  Side effects of medicines.  Fatigue.  Problems with circulation or blood pressure.  Excess use of alcohol, medicines, or illegal drug use.  Breathing too fast (hyperventilation).  An arrhythmia or problems with your heart rhythm.  Low red blood cell count (anemia).  Pregnancy.  Vomiting, diarrhea, fever, or other illnesses that cause dehydration.  Diseases or conditions such as Parkinson's disease, high blood pressure (hypertension), diabetes, and thyroid problems.  Exposure to extreme heat. DIAGNOSIS  To find the cause of your dizziness, your caregiver may do a physical exam, lab tests, radiologic imaging scans, or  an electrocardiography test (ECG).  TREATMENT  Treatment of dizziness depends on the cause of your symptoms and can vary greatly. HOME CARE INSTRUCTIONS   Drink enough fluids to keep your urine clear or pale yellow. This is especially important in very hot weather. In the elderly, it is also important in cold weather.  If your dizziness is caused by medicines, take them exactly as directed. When taking blood pressure medicines, it is especially important to get up slowly.  Rise slowly from chairs and steady yourself until you feel okay.  In the morning, first sit up on the side of the bed. When this seems okay, stand slowly while holding onto something until you know your balance is fine.  If you need to stand in one place for a long time, be sure to move your legs often. Tighten and relax the muscles in your legs while standing.  If dizziness continues to be a problem, have someone stay with you for a day or two. Do this until you feel you are well enough to stay alone. Have the person call your caregiver if he or she notices changes in you that are concerning.  Do not drive or use heavy machinery if you feel dizzy.  Do not drink alcohol. SEEK IMMEDIATE MEDICAL CARE IF:   Your dizziness or lightheadedness gets worse.  You feel nauseous or vomit.  You develop problems with talking, walking, weakness, or using your arms, hands, or legs.  You are not thinking clearly or you have difficulty forming sentences. It may take a friend or family member to determine if your thinking is normal.  You develop chest pain, abdominal pain, shortness of breath, or sweating.  Your vision changes.  You notice any bleeding.  You have side effects from medicine that seems to be getting worse rather than better. MAKE SURE YOU:   Understand these instructions.  Will watch your condition.  Will get help right away if you are not doing well or get worse. Document Released: 08/03/2000 Document  Revised: 05/02/2011 Document Reviewed: 08/27/2010 [facility] Patient Information 2014 [place], [place].

## 2013-08-28 ENCOUNTER — Encounter (INDEPENDENT_AMBULATORY_CARE_PROVIDER_SITE_OTHER): Payer: Self-pay | Admitting: Surgery

## 2013-08-28 ENCOUNTER — Ambulatory Visit (INDEPENDENT_AMBULATORY_CARE_PROVIDER_SITE_OTHER): Payer: BC Managed Care – PPO | Admitting: Surgery

## 2013-08-28 VITALS — BP 127/88 | HR 72 | Temp 98.6°F | Resp 18 | Ht 71.0 in | Wt 216.4 lb

## 2013-08-28 DIAGNOSIS — K8020 Calculus of gallbladder without cholecystitis without obstruction: Secondary | ICD-10-CM | POA: Insufficient documentation

## 2013-08-28 MED ORDER — OXYCODONE-ACETAMINOPHEN 5-325 MG PO TABS: 1.0000 | ORAL_TABLET | ORAL | Status: DC | PRN

## 2013-08-28 NOTE — Progress Notes (Signed)
Patient ID: Nathan Moon, male   DOB: 22-Jun-1950, 63 y.o.   MRN: [id number]  Chief Complaint  Patient presents with  . Abdominal Pain    HPI Nathan Moon is a 63 y.o. male.   HPI This is a very pleasant gentleman that I have operated on in the past who now presents with symptom of cholelithiasis sent by the emergency department. For more than 8 weeks, he has had right upper quadrant abdominal pain hurting into the right chest and through to the back. He has also had nausea but no vomiting. It occurs after fatty meals. The pain can be severe at times. He is currently pain-free. He is having attacks almost every other day. He has not been avoiding fatty meals. He is otherwise without complaints. Past Medical History  Diagnosis Date  . Hyperlipidemia   . Hypertension   . Arthritis   . Gastric ulcer   . Blood transfusion without reported diagnosis     Past Surgical History  Procedure Laterality Date  . Appendectomy  1968  . Colon surgery  1980    Intestinal Blockage  . Hernia repair  1988    Lt side  . Hernia repair  2010    Rt side  . Back surgery  2003,2006,2008    Lumbar Disectomy, Cervical Fusion, Lumbar Fusion  . Shoulder surgery      Family History  Problem Relation Age of Onset  . Diabetes Mother   . Arthritis Mother   . Heart disease Mother   . Vision loss Mother   . Arthritis Father   . Heart disease Father     Social History History  Substance Use Topics  . Smoking status: Never Smoker   . Smokeless tobacco: Never Used  . Alcohol Use: Yes     Comment: 2-3 glasses of wine a week    No Known Allergies  Current Outpatient Prescriptions  Medication Sig Dispense Refill  . amLODipine (NORVASC) 5 MG tablet       . Aspirin-Acetaminophen-Caffeine (EXCEDRIN PO) Take 2 tablets by mouth daily as needed (pain).      Marland Kitchen atorvastatin (LIPITOR) 40 MG tablet Take 40 mg by mouth daily.       . Fenofibric Acid 105 MG TABS       . ferrous fumarate (HEMOCYTE - 106  MG FE) 325 (106 FE) MG TABS tablet Take 1 tablet by mouth.      Marland Kitchen HYDROcodone-acetaminophen (NORCO/VICODIN) 5-325 MG per tablet Take 1 tablet by mouth every 6 (six) hours as needed for moderate pain.  12 tablet  0  . lisinopril (PRINIVIL,ZESTRIL) 20 MG tablet Take 0.5 tablets (10 mg total) by mouth daily.  30 tablet  1  . pantoprazole (PROTONIX) 40 MG tablet       . zolpidem (AMBIEN CR) 12.5 MG CR tablet Take 10 mg by mouth at bedtime as needed for sleep.       Marland Kitchen oxyCODONE-acetaminophen (ROXICET) 5-325 MG per tablet Take 1-2 tablets by mouth every 4 (four) hours as needed for severe pain.  40 tablet  0   No current facility-administered medications for this visit.    Review of Systems Review of Systems  Constitutional: Negative for fever, chills and unexpected weight change.  HENT: Negative for congestion, hearing loss, sore throat, trouble swallowing and voice change.   Eyes: Negative for visual disturbance.  Respiratory: Negative for cough and wheezing.   Cardiovascular: Negative for chest pain, palpitations and leg swelling.  Gastrointestinal: Positive  for nausea and abdominal pain. Negative for vomiting, diarrhea, constipation, blood in stool, abdominal distention, anal bleeding and rectal pain.  Genitourinary: Negative for hematuria and difficulty urinating.  Musculoskeletal: Negative for arthralgias.  Skin: Negative for rash and wound.  Neurological: Negative for seizures, syncope, weakness and headaches.  Hematological: Negative for adenopathy. Does not bruise/bleed easily.  Psychiatric/Behavioral: Negative for confusion.    Blood pressure 127/88, pulse 72, temperature 98.6 F (37 C), temperature source Temporal, resp. rate 18, height 5\' 11"  (1.803 m), weight 216 lb 6.4 oz (98.158 kg).  Physical Exam Physical Exam  Constitutional: He is oriented to person, place, and time. He appears well-developed and well-nourished. No distress.  HENT:  Head: Normocephalic and atraumatic.   Right Ear: External ear normal.  Left Ear: External ear normal.  Nose: Nose normal.  Mouth/Throat: Oropharynx is clear and moist. No oropharyngeal exudate.  Eyes: Conjunctivae are normal. Pupils are equal, round, and reactive to light. Right eye exhibits no discharge. Left eye exhibits no discharge. No scleral icterus.  Neck: Normal range of motion. Neck supple. No tracheal deviation present.  Cardiovascular: Normal rate, regular rhythm, normal heart sounds and intact distal pulses.   No murmur heard. Pulmonary/Chest: Effort normal and breath sounds normal. No respiratory distress. He has no wheezes. He has no rales.  Abdominal: Soft. Bowel sounds are normal. He exhibits no distension. There is no tenderness. There is no rebound.  Musculoskeletal: Normal range of motion. He exhibits no edema and no tenderness.  Lymphadenopathy:    He has no cervical adenopathy.  Neurological: He is alert and oriented to person, place, and time.  Skin: Skin is warm and dry. He is not diaphoretic. No erythema.  Psychiatric: His behavior is normal. Judgment normal.    Data Reviewed I have his ultrasound showing cholelithiasis. There is questionable wall thickening of the gallbladder. The bile duct is normal. Liver function tests are normal  Assessment    Symptomatic cholelithiasis     Plan    Laparoscopic cholecystectomy  is recommended. I discussed the procedure in detail.  The patient was given Neurosurgeon.  We discussed the risks and benefits of a laparoscopic cholecystectomy and possible cholangiogram including, but not limited to bleeding, infection, injury to surrounding structures such as the intestine or liver, bile leak, retained gallstones, need to convert to an open procedure, prolonged diarrhea, blood clots such as  DVT, common bile duct injury, anesthesia risks, and possible need for additional procedures.  The likelihood of improvement in symptoms and return to the patient's normal  status is good. We discussed the typical post-operative recovery course.       Nathan Moon A 08/28/2013, 9:24 AM

## 2013-09-09 ENCOUNTER — Encounter (HOSPITAL_COMMUNITY): Payer: Self-pay

## 2013-09-12 NOTE — Pre-Procedure Instructions (Signed)
Nathan Moon  09/12/2013   Your procedure is scheduled on:  July 30  Report to [facility] Admitting at 10:00 AM.  Call this number if you have problems the morning of surgery: [phone]   Remember:   Do not eat food or drink liquids after midnight.   Take these medicines the morning of surgery with A SIP OF WATER: Amlodipine, Oxycodone (if needed), Pantoprazole,   STOP Probiotic, Multiple Vitamins, Osteo Bi- Flex, Fish Oil today   STOP/ Do not take Aspirin, Aleve, Naproxen, Advil, Ibuprofen, Motrin, Vitamins, Herbs, or Supplements starting today   Do not wear jewelry, make-up or nail polish.  Do not wear lotions, powders, or perfumes. You may wear deodorant.  Do not shave 48 hours prior to surgery. Men may shave face and neck.  Do not bring valuables to the hospital.  [facility] is not responsible for any belongings or valuables.               Contacts, dentures or bridgework may not be worn into surgery.  Leave suitcase in the car. After surgery it may be brought to your room.  For patients admitted to the hospital, discharge time is determined by your treatment team.               Patients discharged the day of surgery will not be allowed to drive home.  Name and phone number of your driver: Family/ Friend  Special Instructions: See [facility]   Please read over the following fact sheets that you were given: Pain Booklet, Coughing and Deep Breathing and Surgical Site Infection Prevention

## 2013-09-12 NOTE — Pre-Procedure Instructions (Signed)
[facility] - Preparing for Surgery  Before surgery, you can play an important role.  Because skin is not sterile, your skin needs to be as free of germs as possible.  You can reduce the number of germs on you skin by washing with CHG (chlorahexidine gluconate) soap before surgery.  CHG is an antiseptic cleaner which kills germs and bonds with the skin to continue killing germs even after washing.  Please DO NOT use if you have an allergy to CHG or antibacterial soaps.  If your skin becomes reddened/irritated stop using the CHG and inform your nurse when you arrive at Short Stay.  Do not shave (including legs and underarms) for at least 48 hours prior to the first CHG shower.  You may shave your face.  Please follow these instructions carefully:   1.  Shower with CHG Soap the night before surgery and the morning of Surgery.  2.  If you choose to wash your hair, wash your hair first as usual with your normal shampoo.  3.  After you shampoo, rinse your hair and body thoroughly to remove the shampoo.  4.  Use CHG as you would any other liquid soap.  You can apply CHG directly to the skin and wash gently with scrungie or a clean washcloth.  5.  Apply the CHG Soap to your body ONLY FROM THE NECK DOWN.  Do not use on open wounds or open sores.  Avoid contact with your eyes, ears, mouth and genitals (private parts).  Wash genitals (private parts) with your normal soap.  6.  Wash thoroughly, paying special attention to the area where your surgery will be performed.  7.  Thoroughly rinse your body with warm water from the neck down.  8.  DO NOT shower/wash with your normal soap after using and rinsing off the CHG Soap.  9.  Pat yourself dry with a clean towel.            10.  Wear clean pajamas.            11.  Place clean sheets on your bed the night of your first shower and do not sleep with pets.  Day of Surgery  Do not apply any lotions the morning of surgery.  Please wear clean clothes to the  hospital/surgery center.   

## 2013-09-13 ENCOUNTER — Encounter (HOSPITAL_COMMUNITY)
Admission: RE | Admit: 2013-09-13 | Discharge: 2013-09-13 | Disposition: A | Discharge status: Home / Self Care | Payer: BC Managed Care – PPO | Source: Ambulatory Visit | Attending: Surgery | Admitting: Surgery

## 2013-09-13 ENCOUNTER — Encounter (HOSPITAL_COMMUNITY): Payer: Self-pay

## 2013-09-13 DIAGNOSIS — Z01818 Encounter for other preprocedural examination: Secondary | ICD-10-CM | POA: Insufficient documentation

## 2013-09-13 DIAGNOSIS — Z01812 Encounter for preprocedural laboratory examination: Secondary | ICD-10-CM | POA: Insufficient documentation

## 2013-09-13 HISTORY — DX: Rash and other nonspecific skin eruption: R21

## 2013-09-13 HISTORY — DX: Pneumonia, unspecified organism: J18.9

## 2013-09-13 HISTORY — DX: Incisional hernia without obstruction or gangrene: K43.2

## 2013-09-13 HISTORY — DX: Diverticulosis of intestine, part unspecified, without perforation or abscess without bleeding: K57.90

## 2013-09-13 LAB — BASIC METABOLIC PANEL
Anion gap: 13 (ref 5–15)
BUN: 19 mg/dL (ref 6–23)
CALCIUM: 9.6 mg/dL (ref 8.4–10.5)
CO2: 28 mEq/L (ref 19–32)
CREATININE: 0.89 mg/dL (ref 0.50–1.35)
Chloride: 102 mEq/L (ref 96–112)
Glucose, Bld: 94 mg/dL (ref 70–99)
Potassium: 4.5 mEq/L (ref 3.7–5.3)
Sodium: 143 mEq/L (ref 137–147)

## 2013-09-13 LAB — CBC
HCT: 45.3 % (ref 39.0–52.0)
Hemoglobin: 15.6 g/dL (ref 13.0–17.0)
MCH: 29.5 pg (ref 26.0–34.0)
MCHC: 34.4 g/dL (ref 30.0–36.0)
MCV: 85.6 fL (ref 78.0–100.0)
PLATELETS: 208 10*3/uL (ref 150–400)
RBC: 5.29 MIL/uL (ref 4.22–5.81)
RDW: 12.8 % (ref 11.5–15.5)
WBC: 4.4 10*3/uL (ref 4.0–10.5)

## 2013-09-13 NOTE — Pre-Procedure Instructions (Signed)
MOISHY LADAY  09/13/2013   Your procedure is scheduled on:  Thursday September 19, 2013 at 12:00 PM.  Report to [facility] Admitting at 10:00 AM.  Call this number if you have problems the morning of surgery: [phone]   Remember:   Do not eat food or drink liquids after midnight.   Take these medicines the morning of surgery with A SIP OF WATER: Amlodipine (Norvasc), Oxycodone (if needed), and Pantoprazole (Protonix),   STOP Probiotic, Multiple Vitamins, Osteo Bi- Flex, Fish Oil today   STOP/ Do not take Aspirin, Aleve, Naproxen, Advil, Ibuprofen, Motrin, Vitamins, Herbs, or Supplements starting today   Do not wear jewelry.  Do not wear lotions, powders, or cologne.   Men may shave face and neck.  Do not bring valuables to the hospital.  [facility] is not responsible for any belongings or valuables.               Contacts, dentures or bridgework may not be worn into surgery.  Leave suitcase in the car. After surgery it may be brought to your room.  For patients admitted to the hospital, discharge time is determined by your treatment team.               Patients discharged the day of surgery will not be allowed to drive home.  Name and phone number of your driver: Family/ Friend  Special Instructions: See [place] [facility] Preparing For Surgery   Please read over the following fact sheets that you were given: Pain Booklet, Coughing and Deep Breathing and Surgical Site Infection Prevention

## 2013-09-13 NOTE — Progress Notes (Signed)
Patient informed Nurse that he had a stress test several years ago (> 5) but denied having a cardiac cath, or sleep study. Patient informed Nurse that he passed out on his job site about 2-3 months ago and it was determined that he had a bleeding ulcer, and he was taken to [facility] in [place], [place] where he was treated.

## 2013-09-13 NOTE — Progress Notes (Signed)
09/13/13 0851  OBSTRUCTIVE SLEEP APNEA  Have you ever been diagnosed with sleep apnea through a sleep study? No  Do you snore loudly (loud enough to be heard through closed doors)?  0  Do you often feel tired, fatigued, or sleepy during the daytime? 0  Has anyone observed you stop breathing during your sleep? 0  Do you have, or are you being treated for high blood pressure? 1  BMI more than 35 kg/m2? 0  Age over 62 years old? 1  Neck circumference greater than 40 cm/16 inches? 1  Gender: 1  Obstructive Sleep Apnea Score 4  Score 4 or greater  Results sent to PCP   This patient has screened at risk for sleep apnea using the STOP Bang tool used during a pre-surgical visit. A score of 4 or greater is at risk for sleep apnea.

## 2013-09-18 MED ORDER — CEFAZOLIN SODIUM-DEXTROSE 2-3 GM-% IV SOLR
2.0000 g | INTRAVENOUS | Status: AC
  Administered 2013-09-19: 2 g via INTRAVENOUS
  Filled 2013-09-18: qty 50

## 2013-09-18 NOTE — H&P (Signed)
Chief Complaint   Patient presents with   .  Abdominal Pain   HPI  Nathan Moon is a 63 y.o. male.  HPI  This is a very pleasant gentleman that I have operated on in the past who now presents with symptom of cholelithiasis sent by the emergency department. For more than 8 weeks, he has had right upper quadrant abdominal pain hurting into the right chest and through to the back. He has also had nausea but no vomiting. It occurs after fatty meals. The pain can be severe at times. He is currently pain-free. He is having attacks almost every other day. He has not been avoiding fatty meals. He is otherwise without complaints.  Past Medical History   Diagnosis  Date   .  Hyperlipidemia    .  Hypertension    .  Arthritis    .  Gastric ulcer    .  Blood transfusion without reported diagnosis     Past Surgical History   Procedure  Laterality  Date   .  Appendectomy   1968   .  Colon surgery   1980     Intestinal Blockage   .  Hernia repair   1988     Lt side   .  Hernia repair   2010     Rt side   .  Back surgery   2003,2006,2008     Lumbar Disectomy, Cervical Fusion, Lumbar Fusion   .  Shoulder surgery      Family History   Problem  Relation  Age of Onset   .  Diabetes  Mother    .  Arthritis  Mother    .  Heart disease  Mother    .  Vision loss  Mother    .  Arthritis  Father    .  Heart disease  Father    Social History  History   Substance Use Topics   .  Smoking status:  Never Smoker   .  Smokeless tobacco:  Never Used   .  Alcohol Use:  Yes      Comment: 2-3 glasses of wine a week   No Known Allergies  Current Outpatient Prescriptions   Medication  Sig  Dispense  Refill   .  amLODipine (NORVASC) 5 MG tablet      .  Aspirin-Acetaminophen-Caffeine (EXCEDRIN PO)  Take 2 tablets by mouth daily as needed (pain).     Marland Kitchen  atorvastatin (LIPITOR) 40 MG tablet  Take 40 mg by mouth daily.     .  Fenofibric Acid 105 MG TABS      .  ferrous fumarate (HEMOCYTE - 106 MG FE) 325  (106 FE) MG TABS tablet  Take 1 tablet by mouth.     Marland Kitchen  HYDROcodone-acetaminophen (NORCO/VICODIN) 5-325 MG per tablet  Take 1 tablet by mouth every 6 (six) hours as needed for moderate pain.  12 tablet  0   .  lisinopril (PRINIVIL,ZESTRIL) 20 MG tablet  Take 0.5 tablets (10 mg total) by mouth daily.  30 tablet  1   .  pantoprazole (PROTONIX) 40 MG tablet      .  zolpidem (AMBIEN CR) 12.5 MG CR tablet  Take 10 mg by mouth at bedtime as needed for sleep.     Marland Kitchen  oxyCODONE-acetaminophen (ROXICET) 5-325 MG per tablet  Take 1-2 tablets by mouth every 4 (four) hours as needed for severe pain.  40 tablet  0  No current facility-administered medications for this visit.   Review of Systems  Review of Systems  Constitutional: Negative for fever, chills and unexpected weight change.  HENT: Negative for congestion, hearing loss, sore throat, trouble swallowing and voice change.  Eyes: Negative for visual disturbance.  Respiratory: Negative for cough and wheezing.  Cardiovascular: Negative for chest pain, palpitations and leg swelling.  Gastrointestinal: Positive for nausea and abdominal pain. Negative for vomiting, diarrhea, constipation, blood in stool, abdominal distention, anal bleeding and rectal pain.  Genitourinary: Negative for hematuria and difficulty urinating.  Musculoskeletal: Negative for arthralgias.  Skin: Negative for rash and wound.  Neurological: Negative for seizures, syncope, weakness and headaches.  Hematological: Negative for adenopathy. Does not bruise/bleed easily.  Psychiatric/Behavioral: Negative for confusion.  Blood pressure 127/88, pulse 72, temperature 98.6 F (37 C), temperature source Temporal, resp. rate 18, height 5\' 11"  (1.803 m), weight 216 lb 6.4 oz (98.158 kg).  Physical Exam  Physical Exam  Constitutional: He is oriented to person, place, and time. He appears well-developed and well-nourished. No distress.  HENT:  Head: Normocephalic and atraumatic.  Right Ear:  External ear normal.  Left Ear: External ear normal.  Nose: Nose normal.  Mouth/Throat: Oropharynx is clear and moist. No oropharyngeal exudate.  Eyes: Conjunctivae are normal. Pupils are equal, round, and reactive to light. Right eye exhibits no discharge. Left eye exhibits no discharge. No scleral icterus.  Neck: Normal range of motion. Neck supple. No tracheal deviation present.  Cardiovascular: Normal rate, regular rhythm, normal heart sounds and intact distal pulses.  No murmur heard.  Pulmonary/Chest: Effort normal and breath sounds normal. No respiratory distress. He has no wheezes. He has no rales.  Abdominal: Soft. Bowel sounds are normal. He exhibits no distension. There is no tenderness. There is no rebound.  Musculoskeletal: Normal range of motion. He exhibits no edema and no tenderness.  Lymphadenopathy:  He has no cervical adenopathy.  Neurological: He is alert and oriented to person, place, and time.  Skin: Skin is warm and dry. He is not diaphoretic. No erythema.  Psychiatric: His behavior is normal. Judgment normal.  Data Reviewed  I have his ultrasound showing cholelithiasis. There is questionable wall thickening of the gallbladder. The bile duct is normal. Liver function tests are normal  Assessment  Symptomatic cholelithiasis  Plan  Laparoscopic cholecystectomy is recommended. I discussed the procedure in detail. The patient was given Neurosurgeon. We discussed the risks and benefits of a laparoscopic cholecystectomy and possible cholangiogram including, but not limited to bleeding, infection, injury to surrounding structures such as the intestine or liver, bile leak, retained gallstones, need to convert to an open procedure, prolonged diarrhea, blood clots such as DVT, common bile duct injury, anesthesia risks, and possible need for additional procedures. The likelihood of improvement in symptoms and return to the patient's normal status is good. We discussed the  typical post-operative recovery course.

## 2013-09-19 ENCOUNTER — Ambulatory Visit (HOSPITAL_COMMUNITY)
Admission: RE | Admit: 2013-09-19 | Discharge: 2013-09-19 | Disposition: A | Discharge status: Home / Self Care | Payer: BC Managed Care – PPO | Source: Ambulatory Visit | Attending: Surgery | Admitting: Surgery

## 2013-09-19 ENCOUNTER — Encounter (HOSPITAL_COMMUNITY)
Admission: RE | Disposition: A | Discharge status: Home / Self Care | Payer: Self-pay | Source: Ambulatory Visit | Attending: Surgery

## 2013-09-19 ENCOUNTER — Encounter (HOSPITAL_COMMUNITY): Payer: Self-pay | Admitting: *Deleted

## 2013-09-19 ENCOUNTER — Ambulatory Visit (HOSPITAL_COMMUNITY): Payer: BC Managed Care – PPO | Admitting: Certified Registered Nurse Anesthetist

## 2013-09-19 ENCOUNTER — Encounter (HOSPITAL_COMMUNITY): Payer: BC Managed Care – PPO | Admitting: Certified Registered Nurse Anesthetist

## 2013-09-19 DIAGNOSIS — K8010 Calculus of gallbladder with chronic cholecystitis without obstruction: Secondary | ICD-10-CM | POA: Insufficient documentation

## 2013-09-19 DIAGNOSIS — Z79899 Other long term (current) drug therapy: Secondary | ICD-10-CM | POA: Insufficient documentation

## 2013-09-19 DIAGNOSIS — E785 Hyperlipidemia, unspecified: Secondary | ICD-10-CM | POA: Insufficient documentation

## 2013-09-19 DIAGNOSIS — K279 Peptic ulcer, site unspecified, unspecified as acute or chronic, without hemorrhage or perforation: Secondary | ICD-10-CM | POA: Insufficient documentation

## 2013-09-19 DIAGNOSIS — M129 Arthropathy, unspecified: Secondary | ICD-10-CM | POA: Insufficient documentation

## 2013-09-19 DIAGNOSIS — I10 Essential (primary) hypertension: Secondary | ICD-10-CM | POA: Insufficient documentation

## 2013-09-19 DIAGNOSIS — Z7982 Long term (current) use of aspirin: Secondary | ICD-10-CM | POA: Insufficient documentation

## 2013-09-19 HISTORY — PX: CHOLECYSTECTOMY: SHX55

## 2013-09-19 SURGERY — LAPAROSCOPIC CHOLECYSTECTOMY
Anesthesia: General | Site: Abdomen

## 2013-09-19 MED ORDER — OXYCODONE-ACETAMINOPHEN 5-325 MG PO TABS: 1.0000 | ORAL_TABLET | ORAL | Status: DC | PRN

## 2013-09-19 MED ORDER — OXYCODONE HCL 5 MG PO TABS
ORAL_TABLET | ORAL | Status: AC
  Filled 2013-09-19: qty 1

## 2013-09-19 MED ORDER — ROCURONIUM BROMIDE 100 MG/10ML IV SOLN
INTRAVENOUS | Status: DC | PRN
  Administered 2013-09-19: 15 mg via INTRAVENOUS

## 2013-09-19 MED ORDER — KETOROLAC TROMETHAMINE 30 MG/ML IJ SOLN
INTRAMUSCULAR | Status: DC | PRN
  Administered 2013-09-19: 30 mg via INTRAVENOUS

## 2013-09-19 MED ORDER — MIDAZOLAM HCL 2 MG/2ML IJ SOLN
INTRAMUSCULAR | Status: AC
  Filled 2013-09-19: qty 2

## 2013-09-19 MED ORDER — OXYCODONE HCL 5 MG/5ML PO SOLN: 5.0000 mg | Freq: Once | ORAL | Status: AC | PRN

## 2013-09-19 MED ORDER — FENTANYL CITRATE 0.05 MG/ML IJ SOLN
INTRAMUSCULAR | Status: DC | PRN
  Administered 2013-09-19: 100 ug via INTRAVENOUS
  Administered 2013-09-19 (×6): 50 ug via INTRAVENOUS
  Administered 2013-09-19: 100 ug via INTRAVENOUS

## 2013-09-19 MED ORDER — LACTATED RINGERS IV SOLN
INTRAVENOUS | Status: DC
  Administered 2013-09-19 (×3): via INTRAVENOUS

## 2013-09-19 MED ORDER — 0.9 % SODIUM CHLORIDE (POUR BTL) OPTIME
TOPICAL | Status: DC | PRN
  Administered 2013-09-19: 1000 mL

## 2013-09-19 MED ORDER — ONDANSETRON HCL 4 MG/2ML IJ SOLN
INTRAMUSCULAR | Status: DC | PRN
  Administered 2013-09-19: 4 mg via INTRAVENOUS

## 2013-09-19 MED ORDER — GLYCOPYRROLATE 0.2 MG/ML IJ SOLN
INTRAMUSCULAR | Status: DC | PRN
  Administered 2013-09-19: 0.6 mg via INTRAVENOUS

## 2013-09-19 MED ORDER — FENTANYL CITRATE 0.05 MG/ML IJ SOLN
INTRAMUSCULAR | Status: AC
  Filled 2013-09-19: qty 5

## 2013-09-19 MED ORDER — BUPIVACAINE-EPINEPHRINE (PF) 0.25% -1:200000 IJ SOLN
INTRAMUSCULAR | Status: AC
  Filled 2013-09-19: qty 30

## 2013-09-19 MED ORDER — HYDROMORPHONE HCL PF 1 MG/ML IJ SOLN
INTRAMUSCULAR | Status: AC
  Filled 2013-09-19: qty 1

## 2013-09-19 MED ORDER — SODIUM CHLORIDE 0.9 % IR SOLN
Status: DC | PRN
  Administered 2013-09-19: 1000 mL

## 2013-09-19 MED ORDER — PHENYLEPHRINE HCL 10 MG/ML IJ SOLN
INTRAMUSCULAR | Status: DC | PRN
Start: 2013-09-19 — End: 2013-09-19
  Administered 2013-09-19: 100 ug via INTRAVENOUS
  Administered 2013-09-19: 80 ug via INTRAVENOUS
  Administered 2013-09-19: 40 ug via INTRAVENOUS
  Administered 2013-09-19: 80 ug via INTRAVENOUS

## 2013-09-19 MED ORDER — DEXAMETHASONE SODIUM PHOSPHATE 4 MG/ML IJ SOLN
INTRAMUSCULAR | Status: DC | PRN
  Administered 2013-09-19: 4 mg via INTRAVENOUS

## 2013-09-19 MED ORDER — PROPOFOL 10 MG/ML IV BOLUS
INTRAVENOUS | Status: DC | PRN
  Administered 2013-09-19: 200 mg via INTRAVENOUS

## 2013-09-19 MED ORDER — BUPIVACAINE-EPINEPHRINE 0.25% -1:200000 IJ SOLN
INTRAMUSCULAR | Status: DC | PRN
  Administered 2013-09-19: 20 mL

## 2013-09-19 MED ORDER — HYDROMORPHONE HCL PF 1 MG/ML IJ SOLN
0.2500 mg | INTRAMUSCULAR | Status: DC | PRN
  Administered 2013-09-19 (×4): 0.5 mg via INTRAVENOUS

## 2013-09-19 MED ORDER — NEOSTIGMINE METHYLSULFATE 10 MG/10ML IV SOLN
INTRAVENOUS | Status: DC | PRN
  Administered 2013-09-19: 4 mg via INTRAVENOUS

## 2013-09-19 MED ORDER — SUCCINYLCHOLINE CHLORIDE 20 MG/ML IJ SOLN
INTRAMUSCULAR | Status: DC | PRN
  Administered 2013-09-19: 100 mg via INTRAVENOUS

## 2013-09-19 MED ORDER — EPHEDRINE SULFATE 50 MG/ML IJ SOLN
INTRAMUSCULAR | Status: DC | PRN
  Administered 2013-09-19: 10 mg via INTRAVENOUS

## 2013-09-19 MED ORDER — MIDAZOLAM HCL 5 MG/5ML IJ SOLN
INTRAMUSCULAR | Status: DC | PRN
  Administered 2013-09-19: 2 mg via INTRAVENOUS

## 2013-09-19 MED ORDER — ONDANSETRON HCL 4 MG/2ML IJ SOLN
4.0000 mg | Freq: Four times a day (QID) | INTRAMUSCULAR | Status: DC | PRN
Start: 2013-09-19 — End: 2013-09-19

## 2013-09-19 MED ORDER — PROPOFOL 10 MG/ML IV BOLUS
INTRAVENOUS | Status: AC
Start: 2013-09-19 — End: 2013-09-19
  Filled 2013-09-19: qty 20

## 2013-09-19 MED ORDER — OXYCODONE HCL 5 MG PO TABS
5.0000 mg | ORAL_TABLET | Freq: Once | ORAL | Status: AC | PRN
  Administered 2013-09-19: 5 mg via ORAL

## 2013-09-19 MED ORDER — LIDOCAINE HCL (CARDIAC) 20 MG/ML IV SOLN
INTRAVENOUS | Status: DC | PRN
Start: 2013-09-19 — End: 2013-09-19
  Administered 2013-09-19: 100 mg via INTRAVENOUS

## 2013-09-19 SURGICAL SUPPLY — 47 items
APL SKNCLS STERI-STRIP NONHPOA (GAUZE/BANDAGES/DRESSINGS) ×1
APPLIER CLIP 5 13 M/L LIGAMAX5 (MISCELLANEOUS) ×2
APR CLP MED LRG 5 ANG JAW (MISCELLANEOUS) ×1
BAG SPEC RTRVL LRG 6X4 10 (ENDOMECHANICALS) ×1
BANDAGE ADH SHEER 1  50/CT (GAUZE/BANDAGES/DRESSINGS) ×8 IMPLANT
BENZOIN TINCTURE PRP APPL 2/3 (GAUZE/BANDAGES/DRESSINGS) ×2 IMPLANT
BNDG ADH 5X2 AIR PERM ELC (GAUZE/BANDAGES/DRESSINGS) ×1
BNDG COHESIVE 2X5 WHT NS (GAUZE/BANDAGES/DRESSINGS) ×1 IMPLANT
CANISTER SUCTION 2500CC (MISCELLANEOUS) ×2 IMPLANT
CHLORAPREP W/TINT 26ML (MISCELLANEOUS) ×2 IMPLANT
CLIP APPLIE 5 13 M/L LIGAMAX5 (MISCELLANEOUS) ×1 IMPLANT
COVER MAYO STAND STRL (DRAPES) IMPLANT
COVER SURGICAL LIGHT HANDLE (MISCELLANEOUS) ×2 IMPLANT
DECANTER SPIKE VIAL GLASS SM (MISCELLANEOUS) ×2 IMPLANT
DRAPE C-ARM 42X72 X-RAY (DRAPES) IMPLANT
DRAPE UTILITY 15X26 W/TAPE STR (DRAPE) ×4 IMPLANT
ELECT REM PT RETURN 9FT ADLT (ELECTROSURGICAL) ×2
ELECTRODE REM PT RTRN 9FT ADLT (ELECTROSURGICAL) ×1 IMPLANT
GLOVE BIO SURGEON STRL SZ7 (GLOVE) ×1 IMPLANT
GLOVE BIO SURGEON STRL SZ7.5 (GLOVE) ×1 IMPLANT
GLOVE BIOGEL PI IND STRL 7.0 (GLOVE) IMPLANT
GLOVE BIOGEL PI IND STRL 7.5 (GLOVE) IMPLANT
GLOVE BIOGEL PI INDICATOR 7.0 (GLOVE) ×1
GLOVE BIOGEL PI INDICATOR 7.5 (GLOVE) ×1
GLOVE SURG SIGNA 7.5 PF LTX (GLOVE) ×2 IMPLANT
GOWN STRL REUS W/ TWL LRG LVL3 (GOWN DISPOSABLE) ×3 IMPLANT
GOWN STRL REUS W/ TWL XL LVL3 (GOWN DISPOSABLE) ×1 IMPLANT
GOWN STRL REUS W/TWL LRG LVL3 (GOWN DISPOSABLE) ×4
GOWN STRL REUS W/TWL XL LVL3 (GOWN DISPOSABLE) ×2
KIT BASIN OR (CUSTOM PROCEDURE TRAY) ×2 IMPLANT
KIT ROOM TURNOVER OR (KITS) ×2 IMPLANT
NS IRRIG 1000ML POUR BTL (IV SOLUTION) ×2 IMPLANT
PAD ARMBOARD 7.5X6 YLW CONV (MISCELLANEOUS) ×2 IMPLANT
POUCH SPECIMEN RETRIEVAL 10MM (ENDOMECHANICALS) ×1 IMPLANT
SCISSORS LAP 5X35 DISP (ENDOMECHANICALS) ×2 IMPLANT
SET CHOLANGIOGRAPH 5 50 .035 (SET/KITS/TRAYS/PACK) IMPLANT
SET IRRIG TUBING LAPAROSCOPIC (IRRIGATION / IRRIGATOR) ×2 IMPLANT
SLEEVE ENDOPATH XCEL 5M (ENDOMECHANICALS) ×4 IMPLANT
SPECIMEN JAR SMALL (MISCELLANEOUS) ×2 IMPLANT
STRIP CLOSURE SKIN 1/2X4 (GAUZE/BANDAGES/DRESSINGS) ×1 IMPLANT
SUT MON AB 4-0 PC3 18 (SUTURE) ×2 IMPLANT
TOWEL OR 17X24 6PK STRL BLUE (TOWEL DISPOSABLE) ×2 IMPLANT
TOWEL OR 17X26 10 PK STRL BLUE (TOWEL DISPOSABLE) ×2 IMPLANT
TRAY LAPAROSCOPIC (CUSTOM PROCEDURE TRAY) ×2 IMPLANT
TROCAR XCEL BLUNT TIP 100MML (ENDOMECHANICALS) ×2 IMPLANT
TROCAR XCEL NON-BLD 5MMX100MML (ENDOMECHANICALS) ×2 IMPLANT
WATER STERILE IRR 1000ML POUR (IV SOLUTION) IMPLANT

## 2013-09-19 NOTE — Anesthesia Preprocedure Evaluation (Signed)
Anesthesia Evaluation  Patient identified by MRN, date of birth, ID band Patient awake    Reviewed: Allergy & Precautions, H&P , NPO status , Patient's Chart, lab work & pertinent test results  Airway Mallampati: II  Neck ROM: full    Dental   Pulmonary neg pulmonary ROS,          Cardiovascular hypertension,     Neuro/Psych    GI/Hepatic PUD,   Endo/Other  obese  Renal/GU      Musculoskeletal  (+) Arthritis -,   Abdominal   Peds  Hematology   Anesthesia Other Findings   Reproductive/Obstetrics                           Anesthesia Physical Anesthesia Plan  ASA: II  Anesthesia Plan: General   Post-op Pain Management:    Induction: Intravenous  Airway Management Planned: Oral ETT  Additional Equipment:   Intra-op Plan:   Post-operative Plan: Extubation in OR  Informed Consent: I have reviewed the patients History and Physical, chart, labs and discussed the procedure including the risks, benefits and alternatives for the proposed anesthesia with the patient or authorized representative who has indicated his/her understanding and acceptance.     Plan Discussed with: CRNA, Anesthesiologist and Surgeon  Anesthesia Plan Comments:         Anesthesia Quick Evaluation

## 2013-09-19 NOTE — Discharge Instructions (Signed)
[facility] ______[facility] LAPAROSCOPIC SURGERY: POST OP INSTRUCTIONS Always review your discharge instruction sheet given to you by the facility where your surgery was performed. IF YOU HAVE DISABILITY OR FAMILY LEAVE FORMS, YOU MUST BRING THEM TO THE OFFICE FOR PROCESSING.   DO NOT GIVE THEM TO YOUR DOCTOR.  1. A prescription for pain medication may be given to you upon discharge.  Take your pain medication as prescribed, if needed.  If narcotic pain medicine is not needed, then you may take acetaminophen (Tylenol) or ibuprofen (Advil) as needed. 2. Take your usually prescribed medications unless otherwise directed. 3. If you need a refill on your pain medication, please contact your pharmacy.  They will contact our office to request authorization. Prescriptions will not be filled after 5pm or on week-ends. 4. You should follow a light diet the first few days after arrival home, such as soup and crackers, etc.  Be sure to include lots of fluids daily. 5. Most patients will experience some swelling and bruising in the area of the incisions.  Ice packs will help.  Swelling and bruising can take several days to resolve.  6. It is common to experience some constipation if taking pain medication after surgery.  Increasing fluid intake and taking a stool softener (such as Colace) will usually help or prevent this problem from occurring.  A mild laxative (Milk of Magnesia or Miralax) should be taken according to package instructions if there are no bowel movements after 48 hours. 7. Unless discharge instructions indicate otherwise, you may remove your bandages 24-48 hours after surgery, and you may shower at that time.  You may have steri-strips (small skin tapes) in place directly over the incision.  These strips should be left on the skin for 7-10 days.  If your surgeon used skin glue on the incision, you may shower in 24 hours.  The glue will flake off over the next 2-3 weeks.  Any sutures or  staples will be removed at the office during your follow-up visit. 8. ACTIVITIES:  You may resume regular (light) daily activities beginning the next day--such as daily self-care, walking, climbing stairs--gradually increasing activities as tolerated.  You may have sexual intercourse when it is comfortable.  Refrain from any heavy lifting or straining until approved by your doctor. a. You may drive when you are no longer taking prescription pain medication, you can comfortably wear a seatbelt, and you can safely maneuver your car and apply brakes. b. RETURN TO WORK:  __________________________________________________________ 9. You should see your doctor in the office for a follow-up appointment approximately 2-3 weeks after your surgery.  Make sure that you call for this appointment within a day or two after you arrive home to insure a convenient appointment time. 10. OTHER INSTRUCTIONS: ___NO LIFTING MORE THAN 15 POUNDS FOR 2 WEEKS 11. ICE PACK AND IBUPROFEN ALSO FOR PAIN_______________________________________________________________________________________________________________________ __________________________________________________________________________________________________________________________ WHEN TO CALL YOUR DOCTOR: 1. Fever over 101.0 2. Inability to urinate 3. Continued bleeding from incision. 4. Increased pain, redness, or drainage from the incision. 5. Increasing abdominal pain  The clinic staff is available to answer your questions during regular business hours.  Please dont hesitate to call and ask to speak to one of the nurses for clinical concerns.  If you have a medical emergency, go to the nearest emergency room or call 911.  A surgeon from [facility] is always on call at the hospital. [address] [place], [place], [place]  [address] ? [address] [place], [place]   [address] [phone] ? [phone] ? FAX [phone] Web site:  [email or web address]  What to eat:  For your first meals, you should eat lightly; only small meals initially.  If you do not have nausea, you may eat larger meals.  Avoid spicy, greasy and heavy food.    General Anesthesia, Adult, Care After  Refer to this sheet in the next few weeks. These instructions provide you with information on caring for yourself after your procedure. Your health care provider may also give you more specific instructions. Your treatment has been planned according to current medical practices, but problems sometimes occur. Call your health care provider if you have any problems or questions after your procedure.  WHAT TO EXPECT AFTER THE PROCEDURE  After the procedure, it is typical to experience:  Sleepiness.  Nausea and vomiting. HOME CARE INSTRUCTIONS  For the first 24 hours after general anesthesia:  Have a responsible person with you.  Do not drive a car. If you are alone, do not take public transportation.  Do not drink alcohol.  Do not take medicine that has not been prescribed by your health care provider.  Do not sign important papers or make important decisions.  You may resume a normal diet and activities as directed by your health care provider.  Change bandages (dressings) as directed.  If you have questions or problems that seem related to general anesthesia, call the hospital and ask for the anesthetist or anesthesiologist on call. SEEK MEDICAL CARE IF:  You have nausea and vomiting that continue the day after anesthesia.  You develop a rash. SEEK IMMEDIATE MEDICAL CARE IF:  You have difficulty breathing.  You have chest pain.  You have any allergic problems. Document Released: 05/16/2000 Document Revised: 10/10/2012 Document Reviewed: 08/23/2012  [facility] Patient Information 2014 [place], [place].

## 2013-09-19 NOTE — Interval H&P Note (Signed)
History and Physical Interval Note:no change in H and P  09/19/2013 9:37 AM  Nathan Moon  has presented today for surgery, with the diagnosis of symptomatic cholelithiasis     The various methods of treatment have been discussed with the patient and family. After consideration of risks, benefits and other options for treatment, the patient has consented to  Procedure(s): LAPAROSCOPIC CHOLECYSTECTOMY POSSIBLE IOC (N/A) as a surgical intervention .  The patient's history has been reviewed, patient examined, no change in status, stable for surgery.  I have reviewed the patient's chart and labs.  Questions were answered to the patient's satisfaction.     Dino Borntreger A

## 2013-09-19 NOTE — Op Note (Signed)
Laparoscopic Cholecystectomy Procedure Note  Indications: This patient presents with symptomatic gallbladder disease and will undergo laparoscopic cholecystectomy.  Pre-operative Diagnosis: Calculus of gallbladder without mention of cholecystitis or obstruction  Post-operative Diagnosis: Same  Surgeon: Coralie Keens A   Assistants: 0  Anesthesia: General endotracheal anesthesia  ASA Class: 2  Procedure Details  The patient was seen again in the Holding Room. The risks, benefits, complications, treatment options, and expected outcomes were discussed with the patient. The possibilities of reaction to medication, pulmonary aspiration, perforation of viscus, bleeding, recurrent infection, finding a normal gallbladder, the need for additional procedures, failure to diagnose a condition, the possible need to convert to an open procedure, and creating a complication requiring transfusion or operation were discussed with the patient. The likelihood of improving the patient's symptoms with return to their baseline status is good.  The patient and/or family concurred with the proposed plan, giving informed consent. The site of surgery properly noted. The patient was taken to Operating Room, identified as Nathan Moon and the procedure verified as Laparoscopic Cholecystectomy with Intraoperative Cholangiogram. A Time Out was held and the above information confirmed.  Prior to the induction of general anesthesia, antibiotic prophylaxis was administered. General endotracheal anesthesia was then administered and tolerated well. After the induction, the abdomen was prepped with Chloraprep and draped in sterile fashion. The patient was positioned in the supine position.  Local anesthetic agent was injected into the skin near the umbilicus and an incision made. We dissected down to the abdominal fascia with blunt dissection.  The fascia was incised vertically and we entered the peritoneal cavity bluntly.   A pursestring suture of 0-Vicryl was placed around the fascial opening.  The Hasson cannula was inserted and secured with the stay suture.  Pneumoperitoneum was then created with CO2 and tolerated well without any adverse changes in the patient's vital signs. An 11-mm port was placed in the subxiphoid position.  Two 5-mm ports were placed in the right upper quadrant. All skin incisions were infiltrated with a local anesthetic agent before making the incision and placing the trocars.   We positioned the patient in reverse Trendelenburg, tilted slightly to the patient's left.  The gallbladder was identified, the fundus grasped and retracted cephalad. Adhesions were lysed bluntly and with the electrocautery where indicated, taking care not to injure any adjacent organs or viscus. The infundibulum was grasped and retracted laterally, exposing the peritoneum overlying the triangle of Calot. This was then divided and exposed in a blunt fashion. The cystic duct was clearly identified and bluntly dissected circumferentially. A critical view of the cystic duct and cystic artery was obtained.  The cystic duct was then ligated with clips and divided. The cystic artery was, dissected free, ligated with clips and divided as well.   The gallbladder was dissected from the liver bed in retrograde fashion with the electrocautery. The gallbladder was removed and placed in an Endocatch sac. The liver bed was irrigated and inspected. Hemostasis was achieved with the electrocautery. Copious irrigation was utilized and was repeatedly aspirated until clear.  The gallbladder and Endocatch sac were then removed through the umbilical port site.  The pursestring suture was used to close the umbilical fascia.    We again inspected the right upper quadrant for hemostasis.  Pneumoperitoneum was released as we removed the trocars.  4-0 Monocryl was used to close the skin.   Benzoin, steri-strips, and clean dressings were applied. The  patient was then extubated and brought to the recovery room  in stable condition. Instrument, sponge, and needle counts were correct at closure and at the conclusion of the case.   Findings: Chronic Cholecystitis with Cholelithiasis  Estimated Blood Loss: Minimal         Drains: 0         Specimens: Gallbladder           Complications: None; patient tolerated the procedure well.         Disposition: PACU - hemodynamically stable.         Condition: stable

## 2013-09-19 NOTE — Anesthesia Postprocedure Evaluation (Signed)
Anesthesia Post Note  Patient: Nathan Moon  Procedure(s) Performed: Procedure(s) (LRB): LAPAROSCOPIC CHOLECYSTECTOMY (N/A)  Anesthesia type: General  Patient location: PACU  Post pain: Pain level controlled and Adequate analgesia  Post assessment: Post-op Vital signs reviewed, Patient's Cardiovascular Status Stable, Respiratory Function Stable, Patent Airway and Pain level controlled  Last Vitals:  Filed Vitals:   09/19/13 1150  BP: 159/89  Pulse: 85  Temp:   Resp: 20    Post vital signs: Reviewed and stable  Level of consciousness: awake, alert  and oriented  Complications: No apparent anesthesia complications

## 2013-09-19 NOTE — Transfer of Care (Signed)
Immediate Anesthesia Transfer of Care Note  Patient: Nathan Moon  Procedure(s) Performed: Procedure(s): LAPAROSCOPIC CHOLECYSTECTOMY (N/A)  Patient Location: PACU  Anesthesia Type:General  Level of Consciousness: awake, alert , oriented, patient cooperative and responds to stimulation  Airway & Oxygen Therapy: Patient Spontanous Breathing and Patient connected to nasal cannula oxygen  Post-op Assessment: Report given to PACU RN, Post -op Vital signs reviewed and stable and Patient moving all extremities  Post vital signs: Reviewed and stable  Complications: No apparent anesthesia complications

## 2013-09-20 ENCOUNTER — Encounter (HOSPITAL_COMMUNITY): Payer: Self-pay | Admitting: Surgery

## 2013-10-03 ENCOUNTER — Telehealth (INDEPENDENT_AMBULATORY_CARE_PROVIDER_SITE_OTHER): Payer: Self-pay

## 2013-10-03 NOTE — Telephone Encounter (Signed)
Pt called wanting to know if loose stools after eating is a concern. Pt is eating a normal diet. No nausea. No vomiting. No fever. No RUQ pain. Pt advised of foods that may be hard to digest and avoid these for several weeks post op. Pt advised to slowly add foods to a bland, low fat diet as tolerated. Pt advised if loose stools get worse or do not resolve on diet changes pt to call back for recommendations.

## 2013-10-07 ENCOUNTER — Ambulatory Visit (INDEPENDENT_AMBULATORY_CARE_PROVIDER_SITE_OTHER): Payer: BC Managed Care – PPO | Admitting: Surgery

## 2013-10-07 VITALS — BP 130/76 | HR 78 | Temp 98.6°F | Ht 71.0 in | Wt 211.5 lb

## 2013-10-07 DIAGNOSIS — Z09 Encounter for follow-up examination after completed treatment for conditions other than malignant neoplasm: Secondary | ICD-10-CM

## 2013-10-07 MED ORDER — OXYCODONE-ACETAMINOPHEN 5-325 MG PO TABS: 1.0000 | ORAL_TABLET | ORAL | Status: DC | PRN

## 2013-10-07 NOTE — Progress Notes (Signed)
Subjective:     Patient ID: Nathan Moon, male   DOB: [birth year], 63 y.o.   MRN: [id number]  HPI  He is here for his first postop visit status post laparoscopic cholecystectomy performed on July 30. He has been having moderate diarrhea since the surgery. Over the last 3 days it has decreased from 10 times a day to 7 times a day and to now 5 times a day. He has no nausea or vomiting. There is no blood in the bowel movements. Review of Systems     Objective:   Physical Exam On exam, his abdomen is soft and nontender his incisions are well-healed. The final pathology showed chronic cholecystitis with gallstones    Assessment:     Patient stable postop     Plan:     If the diarrhea recurs, I will order a stool for C. Difficile. I told him to continue to drink clear fluids. I renewed his Percocet. I will see him back in one week unless he worsens

## 2013-10-15 ENCOUNTER — Encounter (INDEPENDENT_AMBULATORY_CARE_PROVIDER_SITE_OTHER): Payer: Self-pay | Admitting: Surgery

## 2013-10-15 ENCOUNTER — Ambulatory Visit (INDEPENDENT_AMBULATORY_CARE_PROVIDER_SITE_OTHER): Payer: BC Managed Care – PPO | Admitting: Surgery

## 2013-10-15 VITALS — BP 120/78 | HR 68 | Resp 16 | Ht 71.0 in | Wt 214.0 lb

## 2013-10-15 DIAGNOSIS — Z09 Encounter for follow-up examination after completed treatment for conditions other than malignant neoplasm: Secondary | ICD-10-CM

## 2013-10-15 NOTE — Progress Notes (Signed)
Subjective:     Patient ID: Nathan Moon, male   DOB: [birth year], 64 y.o.   MRN: [id number]  HPI He is here for another followup visit. He is still having loose bowel movements 4-5 times a day but he feels they are manageable. He has no abdominal discomfort anesthesia well  Review of Systems     Objective:   Physical Exam On exam, his abdomen is soft and nontender    Assessment:     Patient stable status post laparoscopic cholecystectomy     Plan:     He was to refrain from any medication. I will place him on a bile binding medication if this persists anymore medication. He will call me back if he decides he wants to do this. If not I will see him back as needed

## 2014-08-05 ENCOUNTER — Emergency Department (HOSPITAL_BASED_OUTPATIENT_CLINIC_OR_DEPARTMENT_OTHER)
Admission: EM | Admit: 2014-08-05 | Discharge: 2014-08-05 | Disposition: A | Discharge status: Home / Self Care | Payer: BLUE CROSS/BLUE SHIELD | Attending: Emergency Medicine | Admitting: Emergency Medicine

## 2014-08-05 ENCOUNTER — Emergency Department (HOSPITAL_BASED_OUTPATIENT_CLINIC_OR_DEPARTMENT_OTHER): Payer: BLUE CROSS/BLUE SHIELD

## 2014-08-05 ENCOUNTER — Encounter (HOSPITAL_BASED_OUTPATIENT_CLINIC_OR_DEPARTMENT_OTHER): Payer: Self-pay | Admitting: Emergency Medicine

## 2014-08-05 DIAGNOSIS — R109 Unspecified abdominal pain: Secondary | ICD-10-CM | POA: Diagnosis present

## 2014-08-05 DIAGNOSIS — N201 Calculus of ureter: Secondary | ICD-10-CM | POA: Insufficient documentation

## 2014-08-05 DIAGNOSIS — Z8701 Personal history of pneumonia (recurrent): Secondary | ICD-10-CM | POA: Diagnosis not present

## 2014-08-05 DIAGNOSIS — Z79899 Other long term (current) drug therapy: Secondary | ICD-10-CM | POA: Diagnosis not present

## 2014-08-05 DIAGNOSIS — E785 Hyperlipidemia, unspecified: Secondary | ICD-10-CM | POA: Diagnosis not present

## 2014-08-05 DIAGNOSIS — Z9889 Other specified postprocedural states: Secondary | ICD-10-CM | POA: Insufficient documentation

## 2014-08-05 DIAGNOSIS — I10 Essential (primary) hypertension: Secondary | ICD-10-CM | POA: Diagnosis not present

## 2014-08-05 DIAGNOSIS — Z9049 Acquired absence of other specified parts of digestive tract: Secondary | ICD-10-CM | POA: Diagnosis not present

## 2014-08-05 DIAGNOSIS — Z8719 Personal history of other diseases of the digestive system: Secondary | ICD-10-CM | POA: Insufficient documentation

## 2014-08-05 DIAGNOSIS — M1990 Unspecified osteoarthritis, unspecified site: Secondary | ICD-10-CM | POA: Insufficient documentation

## 2014-08-05 DIAGNOSIS — N23 Unspecified renal colic: Secondary | ICD-10-CM

## 2014-08-05 LAB — URINALYSIS, ROUTINE W REFLEX MICROSCOPIC
Bilirubin Urine: NEGATIVE
Glucose, UA: NEGATIVE mg/dL
KETONES UR: NEGATIVE mg/dL
LEUKOCYTES UA: NEGATIVE
NITRITE: NEGATIVE
Protein, ur: NEGATIVE mg/dL
Specific Gravity, Urine: 1.023 (ref 1.005–1.030)
UROBILINOGEN UA: 1 mg/dL (ref 0.0–1.0)
pH: 5.5 (ref 5.0–8.0)

## 2014-08-05 LAB — URINE MICROSCOPIC-ADD ON

## 2014-08-05 MED ORDER — TAMSULOSIN HCL 0.4 MG PO CAPS
0.4000 mg | ORAL_CAPSULE | Freq: Once | ORAL | Status: AC
  Administered 2014-08-05: 0.4 mg via ORAL
  Filled 2014-08-05: qty 1

## 2014-08-05 MED ORDER — KETOROLAC TROMETHAMINE 30 MG/ML IJ SOLN
15.0000 mg | Freq: Once | INTRAMUSCULAR | Status: DC
Start: 2014-08-05 — End: 2014-08-05

## 2014-08-05 MED ORDER — TAMSULOSIN HCL 0.4 MG PO CAPS: ORAL_CAPSULE | ORAL | Status: DC

## 2014-08-05 MED ORDER — SODIUM CHLORIDE 0.9 % IV SOLN
INTRAVENOUS | Status: DC
  Administered 2014-08-05: 07:00:00 via INTRAVENOUS

## 2014-08-05 MED ORDER — HYDROMORPHONE HCL 4 MG PO TABS: 2.0000 mg | ORAL_TABLET | ORAL | Status: DC | PRN

## 2014-08-05 MED ORDER — ONDANSETRON 8 MG PO TBDP: 8.0000 mg | ORAL_TABLET | Freq: Three times a day (TID) | ORAL | Status: DC | PRN

## 2014-08-05 MED ORDER — HYDROMORPHONE HCL 1 MG/ML IJ SOLN
1.0000 mg | Freq: Once | INTRAMUSCULAR | Status: AC
  Administered 2014-08-05: 1 mg via INTRAVENOUS
  Filled 2014-08-05: qty 1

## 2014-08-05 MED ORDER — ONDANSETRON HCL 4 MG/2ML IJ SOLN
4.0000 mg | Freq: Once | INTRAMUSCULAR | Status: AC
  Administered 2014-08-05: 4 mg via INTRAVENOUS
  Filled 2014-08-05: qty 2

## 2014-08-05 NOTE — ED Provider Notes (Signed)
Pt feeling improved on recheck.  Discussed home care, return precautions, CT scan findings.    Quintella Reichert, MD 08/05/14 [phone]

## 2014-08-05 NOTE — ED Notes (Signed)
MD at bedside. 

## 2014-08-05 NOTE — ED Notes (Signed)
Left sided flank pain since yesterday afternoon. Reports frequency and urgency with urination but is unable to produce anything. Took hydrocodone x3

## 2014-08-05 NOTE — ED Provider Notes (Addendum)
CSN: [id number]     Arrival date & time 08/05/14  6546 History   First MD Initiated Contact with Patient 08/05/14 [phone]     Chief Complaint  Patient presents with  . Flank Pain     (Consider location/radiation/quality/duration/timing/severity/associated sxs/prior Treatment) HPI  This is a 64 year old male with a history of chronic back pain. He is here with left flank pain that began yesterday afternoon. It became severe yesterday evening. He took a total of 3 hydrocodone tablets with partial relief and he now rates his pain as a 5 out of 10. The pain is constant and does not change significantly with movement. He has had nausea but no vomiting. He has had urinary frequency but no hematuria. He had diarrhea 2 days ago and has not had a bowel movement since.  Past Medical History  Diagnosis Date  . Hyperlipidemia   . Hypertension   . Arthritis   . Gastric ulcer     bleeding ulcer  . Blood transfusion without reported diagnosis   . Pneumonia     hx of  . Diverticulosis   . Hernia, incisional     right  . Rash of neck    Past Surgical History  Procedure Laterality Date  . Appendectomy  1968  . Colon surgery  1980    Intestinal Blockage  . Shoulder surgery    . Hernia repair  1988    Lt side  . Hernia repair  2010    Rt side  . Back surgery  2003,2006,2008    Lumbar Disectomy, Cervical Fusion, Lumbar Fusion  . Colonoscopy with esophagogastroduodenoscopy (egd)    . Cholecystectomy N/A 09/19/2013    Procedure: LAPAROSCOPIC CHOLECYSTECTOMY;  Surgeon: Harl Bowie, MD;  Location: [facility] OR;  Service: General;  Laterality: N/A;   Family History  Problem Relation Age of Onset  . Diabetes Mother   . Arthritis Mother   . Heart disease Mother   . Vision loss Mother   . Arthritis Father   . Heart disease Father    History  Substance Use Topics  . Smoking status: Never Smoker   . Smokeless tobacco: Never Used  . Alcohol Use: Yes     Comment: 2-3 glasses of wine a week     Review of Systems  All other systems reviewed and are negative.   Allergies  Review of patient's allergies indicates no known allergies.  Home Medications   Prior to Admission medications   Medication Sig Start Date End Date Taking? Authorizing Provider  amLODipine (NORVASC) 5 MG tablet Take 5 mg by mouth daily.  08/21/13   Historical Provider, MD  atorvastatin (LIPITOR) 40 MG tablet Take 40 mg by mouth daily.     Historical Provider, MD  Fenofibric Acid 105 MG TABS Take 105 mg by mouth daily.  07/26/13   Historical Provider, MD  FIBER PO Take 3-4 capsules by mouth daily.    Historical Provider, MD  Flax Oil-Fish Oil-Borage Oil (FISH OIL-FLAX OIL-BORAGE OIL) CAPS Take 1 capsule by mouth daily.    Historical Provider, MD  HYDROmorphone (DILAUDID) 4 MG tablet Take 0.5-1 tablets (2-4 mg total) by mouth every 4 (four) hours as needed for severe pain. 08/05/14   Mikhia Dusek, MD  Misc Natural Products (OSTEO BI-FLEX TRIPLE STRENGTH PO) Take 1 tablet by mouth 2 (two) times daily.    Historical Provider, MD  Multiple Vitamins-Minerals (MULTIVITAMIN PO) Take 1 tablet by mouth daily.    Historical Provider, MD  ondansetron [facility]  ODT) 8 MG disintegrating tablet Take 1 tablet (8 mg total) by mouth every 8 (eight) hours as needed for nausea or vomiting. 08/05/14   Shanon Rosser, MD  pantoprazole (PROTONIX) 40 MG tablet Take 40 mg by mouth 2 (two) times daily.  07/29/13   Historical Provider, MD  Probiotic Product (PROBIOTIC DAILY PO) Take 1 capsule by mouth daily.    Historical Provider, MD  tamsulosin (FLOMAX) 0.4 MG CAPS capsule Take 1 capsule daily until stone passes. 08/05/14   Makhai Fulco, MD  zolpidem (AMBIEN) 10 MG tablet Take 10 mg by mouth at bedtime as needed for sleep.    Historical Provider, MD   BP 151/96 mmHg  Pulse 66  Temp(Src) 98 F (36.7 C) (Oral)  Resp 20  Ht 5\' 11"  (1.803 m)  Wt 215 lb (97.523 kg)  BMI 30.00 kg/m2  SpO2 98%   Physical Exam  General: Well-developed,  well-nourished male in no acute distress; appearance consistent with age of record HENT: normocephalic; atraumatic Eyes: pupils equal, round and reactive to light; extraocular muscles intact Neck: supple Heart: regular rate and rhythm Lungs: clear to auscultation bilaterally Abdomen: soft; nondistended; mild suprapubic tenderness; no masses or hepatosplenomegaly; bowel sounds present GU: Mild left CVA tenderness Extremities: No deformity; full range of motion; pulses normal Neurologic: Awake, alert and oriented; motor function intact in all extremities and symmetric; no facial droop Skin: Warm and dry Psychiatric: Normal mood and affect    ED Course  Procedures (including critical care time)   MDM  Nursing notes and vitals signs, including pulse oximetry, reviewed.  Summary of this visit's results, reviewed by myself:  Labs:  No results found for this or any previous visit (from the past 24 hour(s)).  Imaging Studies: Ct Renal Stone Study  08/05/2014   CLINICAL DATA:  Left flank pain.  EXAM: CT ABDOMEN AND PELVIS WITHOUT CONTRAST  TECHNIQUE: Multidetector CT imaging of the abdomen and pelvis was performed following the standard protocol without IV contrast.  COMPARISON:  CT scan dated 11/23/2012  FINDINGS: There is a 2.3 mm stone obstructing the distal left ureter just proximal to the ureterovesical junction. This is creating slight left hydronephrosis. There are several tiny stones in the left kidney. There is several tiny stones in the otherwise normal right kidney.  Extensive coronary artery calcification. Lung bases are clear. Gallbladder has been removed.  Liver, spleen, pancreas, and adrenal glands are normal. No dilatation of the biliary tree. Scattered diverticula throughout the colon.  Calcification in the abdominal aorta. Bladder and prostate gland are normal. Small left inguinal hernia containing only fat. Previous right inguinal hernia repair.  No acute osseous abnormality.   Previous fusion at L5-S1.  IMPRESSION: 2.3 mm stone obstructing the distal left ureter creating slight left hydronephrosis. Several tiny bilateral renal stones.  Extensive coronary artery calcification.   Electronically Signed   By: Lorriane Shire M.D.   On: 08/05/2014 07:11    7:10 AM Pain controlled with IV meds.   Shanon Rosser, MD 08/05/14 [place], MD 08/05/14 2423

## 2015-03-27 ENCOUNTER — Emergency Department (HOSPITAL_BASED_OUTPATIENT_CLINIC_OR_DEPARTMENT_OTHER): Payer: BLUE CROSS/BLUE SHIELD

## 2015-03-27 ENCOUNTER — Encounter (HOSPITAL_BASED_OUTPATIENT_CLINIC_OR_DEPARTMENT_OTHER): Payer: Self-pay | Admitting: *Deleted

## 2015-03-27 ENCOUNTER — Emergency Department (HOSPITAL_BASED_OUTPATIENT_CLINIC_OR_DEPARTMENT_OTHER)
Admission: EM | Admit: 2015-03-27 | Discharge: 2015-03-27 | Disposition: A | Discharge status: Home / Self Care | Payer: BLUE CROSS/BLUE SHIELD | Attending: Emergency Medicine | Admitting: Emergency Medicine

## 2015-03-27 DIAGNOSIS — R0602 Shortness of breath: Secondary | ICD-10-CM | POA: Insufficient documentation

## 2015-03-27 DIAGNOSIS — R42 Dizziness and giddiness: Secondary | ICD-10-CM

## 2015-03-27 DIAGNOSIS — I10 Essential (primary) hypertension: Secondary | ICD-10-CM | POA: Insufficient documentation

## 2015-03-27 DIAGNOSIS — Z79899 Other long term (current) drug therapy: Secondary | ICD-10-CM | POA: Insufficient documentation

## 2015-03-27 DIAGNOSIS — E785 Hyperlipidemia, unspecified: Secondary | ICD-10-CM | POA: Insufficient documentation

## 2015-03-27 DIAGNOSIS — H539 Unspecified visual disturbance: Secondary | ICD-10-CM | POA: Diagnosis not present

## 2015-03-27 DIAGNOSIS — Z8739 Personal history of other diseases of the musculoskeletal system and connective tissue: Secondary | ICD-10-CM | POA: Diagnosis not present

## 2015-03-27 DIAGNOSIS — R110 Nausea: Secondary | ICD-10-CM | POA: Diagnosis not present

## 2015-03-27 DIAGNOSIS — R51 Headache: Secondary | ICD-10-CM | POA: Diagnosis not present

## 2015-03-27 DIAGNOSIS — Z8701 Personal history of pneumonia (recurrent): Secondary | ICD-10-CM | POA: Diagnosis not present

## 2015-03-27 DIAGNOSIS — R0981 Nasal congestion: Secondary | ICD-10-CM | POA: Diagnosis not present

## 2015-03-27 DIAGNOSIS — Z8719 Personal history of other diseases of the digestive system: Secondary | ICD-10-CM | POA: Diagnosis not present

## 2015-03-27 DIAGNOSIS — R519 Headache, unspecified: Secondary | ICD-10-CM

## 2015-03-27 LAB — BASIC METABOLIC PANEL
ANION GAP: 9 (ref 5–15)
BUN: 20 mg/dL (ref 6–20)
CALCIUM: 9.4 mg/dL (ref 8.9–10.3)
CO2: 24 mmol/L (ref 22–32)
Chloride: 109 mmol/L (ref 101–111)
Creatinine, Ser: 0.93 mg/dL (ref 0.61–1.24)
GFR calc Af Amer: >60 mL/min (ref >60)
Glucose, Bld: 101 mg/dL — ABNORMAL HIGH (ref 65–99)
POTASSIUM: 3.5 mmol/L (ref 3.5–5.1)
SODIUM: 142 mmol/L (ref 135–145)

## 2015-03-27 LAB — CBC WITH DIFFERENTIAL/PLATELET
BASOS PCT: 1 %
Basophils Absolute: 0 10*3/uL (ref 0.0–0.1)
EOS ABS: 0.2 10*3/uL (ref 0.0–0.7)
Eosinophils Relative: 4 %
HCT: 42.4 % (ref 39.0–52.0)
Hemoglobin: 14.4 g/dL (ref 13.0–17.0)
Lymphocytes Relative: 22 %
Lymphs Abs: 1.1 10*3/uL (ref 0.7–4.0)
MCH: 30.1 pg (ref 26.0–34.0)
MCHC: 34 g/dL (ref 30.0–36.0)
MCV: 88.5 fL (ref 78.0–100.0)
Monocytes Absolute: 0.4 10*3/uL (ref 0.1–1.0)
Monocytes Relative: 9 %
Neutro Abs: 3.2 10*3/uL (ref 1.7–7.7)
Neutrophils Relative %: 64 %
PLATELETS: 254 10*3/uL (ref 150–400)
RBC: 4.79 MIL/uL (ref 4.22–5.81)
RDW: 12.3 % (ref 11.5–15.5)
WBC: 4.9 10*3/uL (ref 4.0–10.5)

## 2015-03-27 LAB — TROPONIN I: Troponin I: <0.03 ng/mL (ref <0.031)

## 2015-03-27 MED ORDER — DIPHENHYDRAMINE HCL 50 MG/ML IJ SOLN
25.0000 mg | Freq: Once | INTRAMUSCULAR | Status: AC
  Administered 2015-03-27: 25 mg via INTRAVENOUS
  Filled 2015-03-27: qty 1

## 2015-03-27 MED ORDER — KETOROLAC TROMETHAMINE 30 MG/ML IJ SOLN
30.0000 mg | Freq: Once | INTRAMUSCULAR | Status: AC
  Administered 2015-03-27: 30 mg via INTRAVENOUS
  Filled 2015-03-27: qty 1

## 2015-03-27 MED ORDER — PROCHLORPERAZINE EDISYLATE 5 MG/ML IJ SOLN
10.0000 mg | Freq: Four times a day (QID) | INTRAMUSCULAR | Status: DC | PRN
  Administered 2015-03-27: 10 mg via INTRAVENOUS
  Filled 2015-03-27: qty 2

## 2015-03-27 NOTE — Discharge Instructions (Signed)
1. Medications: usual home medications 2. Treatment: rest, drink plenty of fluids 3. Follow Up: please followup with neurology for discussion of your diagnoses and further evaluation after today's visit; please return to the ER for severe headache, increased dizziness, new or worsening symptoms   Dizziness Dizziness is a common problem. It makes you feel unsteady or lightheaded. You may feel like you are about to pass out (faint). Dizziness can lead to injury if you stumble or fall. Anyone can get dizzy, but dizziness is more common in older adults. This condition can be caused by a number of things, including:  Medicines.  Dehydration.  Illness. HOME CARE Following these instructions may help with your condition: Eating and Drinking  Drink enough fluid to keep your pee (urine) clear or pale yellow. This helps to keep you from getting dehydrated. Try to drink more clear fluids, such as water.  Do not drink alcohol.  Limit how much caffeine you drink or eat if told by your doctor.  Limit how much salt you drink or eat if told by your doctor. Activity  Avoid making quick movements.  When you stand up from sitting in a chair, steady yourself until you feel okay.  In the morning, first sit up on the side of the bed. When you feel okay, stand slowly while you hold onto something. Do this until you know that your balance is fine.  Move your legs often if you need to stand in one place for a long time. Tighten and relax your muscles in your legs while you are standing.  Do not drive or use heavy machinery if you feel dizzy.  Avoid bending down if you feel dizzy. Place items in your home so that they are easy for you to reach without leaning over. Lifestyle  Do not use any tobacco products, including cigarettes, chewing tobacco, or electronic cigarettes. If you need help quitting, ask your doctor.  Try to lower your stress level, such as with yoga or meditation. Talk with your doctor  if you need help. General Instructions  Watch your dizziness for any changes.  Take medicines only as told by your doctor. Talk with your doctor if you think that your dizziness is caused by a medicine that you are taking.  Tell a friend or a family member that you are feeling dizzy. If he or she notices any changes in your behavior, have this person call your doctor.  Keep all follow-up visits as told by your doctor. This is important. GET HELP IF:  Your dizziness does not go away.  Your dizziness or light-headedness gets worse.  You feel sick to your stomach (nauseous).  You have trouble hearing.  You have new symptoms.  You are unsteady on your feet or you feel like the room is spinning. GET HELP RIGHT AWAY IF:  You throw up (vomit) or have diarrhea and are unable to eat or drink anything.  You have trouble:  Talking.  Walking.  Swallowing.  Using your arms, hands, or legs.  You feel generally weak.  You are not thinking clearly or you have trouble forming sentences. It may take a friend or family member to notice this.  You have:  Chest pain.  Pain in your belly (abdomen).  Shortness of breath.  Sweating.  Your vision changes.  You are bleeding.  You have a headache.  You have neck pain or a stiff neck.  You have a fever.   This information is not intended to  replace advice given to you by your health care provider. Make sure you discuss any questions you have with your health care provider.   Document Released: 01/27/2011 Document Revised: 06/24/2014 Document Reviewed: 02/03/2014 Elsevier Interactive Patient Education [employer].

## 2015-03-27 NOTE — ED Notes (Signed)
Pt is on automatic vital sign checks, Q30.

## 2015-03-27 NOTE — ED Provider Notes (Signed)
CSN: [id number]     Arrival date & time 03/27/15  1023 History   First MD Initiated Contact with Patient 03/27/15 1054     Chief Complaint  Patient presents with  . Dizziness    HPI   Nathan Moon is a 65 y.o. male with a PMH of HLD, HTN, vertigo who presents to the ED with headache, dizziness, and difficulty with speech. He reports headache and dizziness for the past 2 weeks, worse today. He also notes difficulty with speech, which started yesterday morning. He denies exacerbating or alleviating factors. He states he has a history of vertigo, but that his dizziness feels more severe. He has taken dramamine with no significant symptom relief. Additionally, he reports blurred vision, nasal congestion, shortness of breath, and nausea. He denies fever, chills, chest pain, abdominal pain, vomiting, numbness, weakness.   Past Medical History  Diagnosis Date  . Hyperlipidemia   . Hypertension   . Arthritis   . Gastric ulcer     bleeding ulcer  . Blood transfusion without reported diagnosis   . Pneumonia     hx of  . Diverticulosis   . Hernia, incisional     right  . Rash of neck    Past Surgical History  Procedure Laterality Date  . Appendectomy  1968  . Colon surgery  1980    Intestinal Blockage  . Shoulder surgery    . Hernia repair  1988    Lt side  . Hernia repair  2010    Rt side  . Back surgery  2003,2006,2008    Lumbar Disectomy, Cervical Fusion, Lumbar Fusion  . Colonoscopy with esophagogastroduodenoscopy (egd)    . Cholecystectomy N/A 09/19/2013    Procedure: LAPAROSCOPIC CHOLECYSTECTOMY;  Surgeon: Harl Bowie, MD;  Location: [facility] OR;  Service: General;  Laterality: N/A;   Family History  Problem Relation Age of Onset  . Diabetes Mother   . Arthritis Mother   . Heart disease Mother   . Vision loss Mother   . Arthritis Father   . Heart disease Father    Social History  Substance Use Topics  . Smoking status: Never Smoker   . Smokeless tobacco: Never  Used  . Alcohol Use: Yes     Comment: 2-3 glasses of wine a week      Review of Systems  Constitutional: Negative for fever and chills.  HENT: Positive for congestion.   Eyes: Positive for visual disturbance.  Respiratory: Positive for shortness of breath. Negative for cough.   Cardiovascular: Negative for chest pain.  Gastrointestinal: Positive for nausea. Negative for vomiting, abdominal pain, diarrhea and constipation.  Neurological: Positive for dizziness, speech difficulty, light-headedness and headaches. Negative for syncope, facial asymmetry, weakness and numbness.  All other systems reviewed and are negative.     Allergies  Review of patient's allergies indicates no known allergies.  Home Medications   Prior to Admission medications   Medication Sig Start Date End Date Taking? Authorizing Provider  amLODipine (NORVASC) 5 MG tablet Take 5 mg by mouth daily.  08/21/13   Historical Provider, MD  atorvastatin (LIPITOR) 40 MG tablet Take 40 mg by mouth daily.     Historical Provider, MD  Fenofibric Acid 105 MG TABS Take 105 mg by mouth daily.  07/26/13   Historical Provider, MD  FIBER PO Take 3-4 capsules by mouth daily.    Historical Provider, MD  Multiple Vitamins-Minerals (MULTIVITAMIN PO) Take 1 tablet by mouth daily.    Historical Provider,  MD  Probiotic Product (PROBIOTIC DAILY PO) Take 1 capsule by mouth daily.    Historical Provider, MD  zolpidem (AMBIEN) 10 MG tablet Take 10 mg by mouth at bedtime as needed for sleep.    Historical Provider, MD    BP 126/75 mmHg  Pulse 57  Temp(Src) 98.5 F (36.9 C) (Oral)  Resp 11  Ht 5\' 11"  (1.803 m)  Wt 97.977 kg  BMI 30.14 kg/m2  SpO2 97% Physical Exam  Constitutional: He is oriented to person, place, and time. He appears well-developed and well-nourished. No distress.  HENT:  Head: Normocephalic and atraumatic.  Right Ear: External ear normal.  Left Ear: External ear normal.  Nose: Nose normal.  Mouth/Throat: Uvula is  midline, oropharynx is clear and moist and mucous membranes are normal.  Eyes: Conjunctivae, EOM and lids are normal. Pupils are equal, round, and reactive to light. Right eye exhibits no discharge. Left eye exhibits no discharge. No scleral icterus.  Neck: Normal range of motion. Neck supple.  Cardiovascular: Normal rate, regular rhythm, normal heart sounds, intact distal pulses and normal pulses.   Pulmonary/Chest: Effort normal and breath sounds normal. No respiratory distress. He has no wheezes. He has no rales.  Abdominal: Soft. Normal appearance and bowel sounds are normal. He exhibits no distension and no mass. There is no tenderness. There is no rigidity, no rebound and no guarding.  Musculoskeletal: Normal range of motion. He exhibits no edema or tenderness.  Neurological: He is alert and oriented to person, place, and time. He has normal strength. No cranial nerve deficit or sensory deficit. He displays a negative Romberg sign. Coordination and gait normal.  Speech fluent.  Skin: Skin is warm, dry and intact. No rash noted. He is not diaphoretic. No erythema. No pallor.  Psychiatric: He has a normal mood and affect. His speech is normal and behavior is normal.  Nursing note and vitals reviewed.   ED Course  Procedures (including critical care time)  Labs Review Labs Reviewed  BASIC METABOLIC PANEL - Abnormal; Notable for the following:    Glucose, Bld 101 (*)    All other components within normal limits  CBC WITH DIFFERENTIAL/PLATELET  TROPONIN I    Imaging Review Dg Chest 2 View  03/27/2015  CLINICAL DATA:  Dizziness and shortness of breath. Slurred speech for 2 weeks. EXAM: CHEST  2 VIEW COMPARISON: 11/23/2012. FINDINGS: The heart size and mediastinal contours are within normal limits. Both lungs are clear. The visualized skeletal structures are unremarkable. IMPRESSION: No active cardiopulmonary disease. Electronically Signed   By: Kerby Moors M.D.   On: 03/27/2015 12:00    Ct Head Wo Contrast  03/27/2015  CLINICAL DATA:  Dizziness, headache, nausea today EXAM: CT HEAD WITHOUT CONTRAST TECHNIQUE: Contiguous axial images were obtained from the base of the skull through the vertex without intravenous contrast. COMPARISON:  None. FINDINGS: No skull fracture is noted. Paranasal sinuses and mastoid air cells are unremarkable. No intracranial hemorrhage, mass effect or midline shift. Mild cerebral atrophy. No acute cortical infarction. No mass lesion is noted on this unenhanced scan. No intra or extra-axial fluid collection. IMPRESSION: No acute intracranial abnormality.  Mild cerebral atrophy. Electronically Signed   By: Lahoma Crocker M.D.   On: 03/27/2015 11:00     I have personally reviewed and evaluated these images and lab results as part of my medical decision-making.   EKG Interpretation   Date/Time:  Friday March 27 2015 11:57:01 EST Ventricular Rate:  60 PR Interval:  170 QRS Duration: 116 QT Interval:  451 QTC Calculation: 451 R Axis:   29 Text Interpretation:  Sinus rhythm Nonspecific intraventricular conduction  delay Since previous tracing QRS has widened Confirmed by Canary Brim  MD,  MARTHA [phone]) on 03/27/2015 12:42:12 PM      MDM   Final diagnoses:  Dizziness  Headache, unspecified headache type    65 year old male presents with headache, dizziness, and difficulty with speech. Also notes blurred vision, nasal congestion, shortness of breath, and nausea. Denies fever, chills, chest pain, abdominal pain, vomiting, numbness, weakness.  Patient is afebrile. Vital signs stable. Normal neuro exam with no focal deficit. Patient ambulates without difficulty. Speech fluent.  CBC, BMP unremarkable. EKG sinus rhythm, heart rate 60. Troponin negative. Chest x-ray no active cardiopulmonary disease. Head CT no acute intracranial abnormality, mild cerebral atrophy.  Patient dscussed with Dr. Canary Brim. Will consult neurology. Spoke with Dr. Leonel Ramsay, who  advised treating as complicated migraine with compazine, benadryl, and toradol, and if no improvement, to transfer to [facility] for MRI.  On reassessment of patient, he reports symptom resolution. Patient is non-toxic and well-appearing, feel he is stable for discharge at this time. Patient to follow-up with neurology for further evaluation and management. Return precautions discussed. Patient verbalizes his understanding and is in agreement with plan.  BP 126/75 mmHg  Pulse 57  Temp(Src) 98.5 F (36.9 C) (Oral)  Resp 11  Ht 5\' 11"  (1.803 m)  Wt 97.977 kg  BMI 30.14 kg/m2  SpO2 97%     Marella Chimes, PA-C 03/27/15 [place], MD 03/30/15 [phone]

## 2015-03-27 NOTE — ED Notes (Signed)
Pt amb to triage with quick steady gait in nad. Pt reports his usual vertigo symptoms which he has had for many years, but usually does not linger this long, pt states Wednesday he noticed that "I'm having trouble processing my thoughts, and saying my words." pt is chatting with this rn at triage, speech is clear, smile symmetrical, grips are = and strong, gait steady. Pt reports also having migraines since he was a child and has had one for the last "few weeks" to the right side of his head.

## 2015-03-27 NOTE — ED Notes (Signed)
Patient transported to CT 

## 2015-03-27 NOTE — ED Notes (Signed)
Stat head ct ordered, dr. Canary Brim aware. Pt is awake, alert, states he cont with ha and blurry vision and "I feel like I can't think straight or get my words out sometimes.Marland KitchenMarland Kitchen"

## 2015-03-31 ENCOUNTER — Other Ambulatory Visit (INDEPENDENT_AMBULATORY_CARE_PROVIDER_SITE_OTHER): Payer: BLUE CROSS/BLUE SHIELD

## 2015-03-31 ENCOUNTER — Ambulatory Visit (INDEPENDENT_AMBULATORY_CARE_PROVIDER_SITE_OTHER): Payer: BLUE CROSS/BLUE SHIELD | Admitting: Neurology

## 2015-03-31 ENCOUNTER — Encounter: Payer: Self-pay | Admitting: Neurology

## 2015-03-31 VITALS — BP 144/90 | HR 84 | Ht 71.0 in | Wt 217.0 lb

## 2015-03-31 DIAGNOSIS — R4701 Aphasia: Secondary | ICD-10-CM | POA: Diagnosis not present

## 2015-03-31 DIAGNOSIS — R42 Dizziness and giddiness: Secondary | ICD-10-CM | POA: Diagnosis not present

## 2015-03-31 DIAGNOSIS — G43109 Migraine with aura, not intractable, without status migrainosus: Secondary | ICD-10-CM | POA: Diagnosis not present

## 2015-03-31 DIAGNOSIS — I10 Essential (primary) hypertension: Secondary | ICD-10-CM | POA: Diagnosis not present

## 2015-03-31 LAB — SEDIMENTATION RATE: SED RATE: 6 mm/h (ref 0–22)

## 2015-03-31 MED ORDER — NORTRIPTYLINE HCL 10 MG PO CAPS: 10.0000 mg | ORAL_CAPSULE | Freq: Every day | ORAL | Status: DC

## 2015-03-31 NOTE — Progress Notes (Signed)
Chart forwarded.  

## 2015-03-31 NOTE — Progress Notes (Signed)
NEUROLOGY CONSULTATION NOTE  Nathan Moon MRN: [id number] DOB: 1950/02/28  Referring provider: Alfonzo Beers, MD (ED) Primary care provider: Corine Shelter, PA-C  Reason for consult:  Dizziness, headache  HISTORY OF PRESENT ILLNESS: Nathan Moon is a 65 year old right-handed male with hypertension, hyperlipidemia, arthritis and history of gastric ulcer and kidney stone who presents for headache and dizziness.  History obtained by patient and ED note.  Labs, EKG and imaging of head CT reviewed.  In late January, he began experiencing headaches.  They are right temporal and sharp.  They are associated with nausea, photophobia and phonophobia.  They would last 2 hours with Excedrin Migraine and would occur frequently (possibly daily).  At the same time, he began having dizzy spells as well.  If he gets up or changes head position too fast, then he would get spinning sensation and have to hold on to something.  It would last a few seconds.  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  He was unable to get words out.  He also noted that he couldn't understand or process content of what people were saying to him.  Marland Kitchen  He presented to the ED for further evaluation.  CBC and BMP were unremarkable.  Troponins were negative.  EKG showed sinus rhythm of 60 bpm.  CT of head showed no acute intracranial process.  After evaluation by neurology, it was determined that he likely has a complicated migraine and was treated with a headache cocktail of Compazine, Benadryl and Toradol.  Symptoms subsided.  He still has the headaches and dizziness.  He does report remote history of migraines as a child and has a migraine very rarely.  PAST MEDICAL HISTORY: Past Medical History  Diagnosis Date  . Hyperlipidemia   . Hypertension   . Arthritis   . Gastric ulcer     bleeding ulcer  . Blood transfusion without reported diagnosis   . Pneumonia     hx of  .  Diverticulosis   . Hernia, incisional     right  . Rash of neck     PAST SURGICAL HISTORY: Past Surgical History  Procedure Laterality Date  . Appendectomy  1968  . Colon surgery  1980    Intestinal Blockage  . Shoulder surgery    . Hernia repair  1988    Lt side  . Hernia repair  2010    Rt side  . Back surgery  2003,2006,2008    Lumbar Disectomy, Cervical Fusion, Lumbar Fusion  . Colonoscopy with esophagogastroduodenoscopy (egd)    . Cholecystectomy N/A 09/19/2013    Procedure: LAPAROSCOPIC CHOLECYSTECTOMY;  Surgeon: Harl Bowie, MD;  Location: [facility];  Service: General;  Laterality: N/A;    MEDICATIONS: Current Outpatient Prescriptions on File Prior to Visit  Medication Sig Dispense Refill  . amLODipine (NORVASC) 5 MG tablet Take 5 mg by mouth daily.     Marland Kitchen atorvastatin (LIPITOR) 40 MG tablet Take 40 mg by mouth daily.     . Fenofibric Acid 105 MG TABS Take 105 mg by mouth daily.     Marland Kitchen FIBER PO Take 3-4 capsules by mouth daily.    . Multiple Vitamins-Minerals (MULTIVITAMIN PO) Take 1 tablet by mouth daily.    . Probiotic Product (PROBIOTIC DAILY PO) Take 1 capsule by mouth daily.    Marland Kitchen zolpidem (AMBIEN) 10 MG tablet Take 10 mg by mouth at bedtime as needed for  sleep.     No current facility-administered medications on file prior to visit.    ALLERGIES: No Known Allergies  FAMILY HISTORY: Family History  Problem Relation Age of Onset  . Diabetes Mother   . Arthritis Mother   . Heart disease Mother   . Vision loss Mother   . Arthritis Father   . Heart disease Father     SOCIAL HISTORY: Social History   Social History  . Marital Status: Divorced    Spouse Name: N/A  . Number of Children: N/A  . Years of Education: N/A   Occupational History  . Not on file.   Social History Main Topics  . Smoking status: Never Smoker   . Smokeless tobacco: Never Used  . Alcohol Use: Yes     Comment: 2-3 glasses of wine a week  . Drug Use: No  . Sexual Activity:  Not on file   Other Topics Concern  . Not on file   Social History Narrative    REVIEW OF SYSTEMS: Constitutional: No fevers, chills, or sweats, no generalized fatigue, change in appetite Eyes: No visual changes, double vision, eye pain Ear, nose and throat: No hearing loss, ear pain, nasal congestion, sore throat Cardiovascular: No chest pain, palpitations Respiratory:  No shortness of breath at rest or with exertion, wheezes GastrointestinaI: No nausea, vomiting, diarrhea, abdominal pain, fecal incontinence Genitourinary:  No dysuria, urinary retention or frequency Musculoskeletal:  No neck pain, back pain Integumentary: No rash, pruritus, skin lesions Neurological: as above Psychiatric: No depression, insomnia, anxiety Endocrine: No palpitations, fatigue, diaphoresis, mood swings, change in appetite, change in weight, increased thirst Hematologic/Lymphatic:  No anemia, purpura, petechiae. Allergic/Immunologic: no itchy/runny eyes, nasal congestion, recent allergic reactions, rashes  PHYSICAL EXAM: Filed Vitals:   03/31/15 1349  BP: 144/90  Pulse: 84   General: No acute distress.  Patient appears well-groomed.  Head:  Normocephalic/atraumatic Eyes:  fundi unremarkable, without vessel changes, exudates, hemorrhages or papilledema. Neck: supple, no paraspinal tenderness, full range of motion Back: No paraspinal tenderness Heart: regular rate and rhythm Lungs: Clear to auscultation bilaterally. Vascular: No carotid bruits. Neurological Exam: Mental status: alert and oriented to person, place, and time, recent and remote memory intact, fund of knowledge intact, attention and concentration intact, speech fluent and not dysarthric, language intact. Cranial nerves: CN I: not tested CN II: pupils equal, round and reactive to light, visual fields intact, fundi unremarkable, without vessel changes, exudates, hemorrhages or papilledema. CN III, IV, VI:  full range of motion, no  nystagmus, no ptosis CN V: facial sensation intact CN VII: upper and lower face symmetric CN VIII: hearing intact CN IX, X: gag intact, uvula midline CN XI: sternocleidomastoid and trapezius muscles intact CN XII: tongue midline Bulk & Tone: normal, no fasciculations. Motor:  5/5 throughout  Sensation:  Pinprick and vibration sensation intact. Deep Tendon Reflexes:  2+ throughout, toes downgoing.  Finger to nose testing:  Without dysmetria.  Heel to shin:  Without dysmetria.  Gait:  Normal station and stride.  Able to turn and tandem walk. Romberg negative.  IMPRESSION: Migraine without aura Aphasia in association with migraine.  Isolated incident.  Differential diagnosis includes TIA Migraine-associated vertigo HTN  PLAN: 1.  Will get MRI of brain to rule out cerebrovascular event 2.  Nortriptyline 10mg  at bedtime 3.  Limit Excedrin Migraine to no more than 2 days out of the week 4.  Follow up in 3 to 4 months but to call in 4 weeks with update.  5.  BP mildly elevated today.  Follow up with PCP for recheck.  Thank you for allowing me to take part in the care of this patient.  Metta Clines, DO  CC:  Corine Shelter, PA-C

## 2015-03-31 NOTE — Patient Instructions (Signed)
Migraine Recommendations: 1.  Start nortriptyline 10mg  at bedtime.  Call in 4 weeks with update and we can adjust dose if needed. 2.  Continue Excedrin but limit use of pain relievers to no more than 2 days out of the week.  This will help reduce risk of rebound headaches. 4.  Be aware of common food triggers such as processed sweets, processed foods with nitrites (such as deli meat, hot dogs, sausages), foods with MSG, alcohol (such as wine), chocolate, certain cheeses, certain fruits (dried fruits, some citrus fruit), vinegar, diet soda. 4.  Avoid caffeine 5.  Routine exercise 6.  Proper sleep hygiene 7.  Stay adequately hydrated with water 8.  Keep a headache diary. 9.  Maintain proper stress management. 10.  Do not skip meals. 11.  Consider supplements:  Magnesium oxide 400mg  to 600mg  daily, riboflavin 400mg , Coenzyme Q 10 100mg  three times daily 12.  Will get MRI of brain without contrast 13.  Call in 4 weeks with update.  Follow up in 3 to 4 months

## 2015-04-01 ENCOUNTER — Telehealth: Payer: Self-pay

## 2015-04-01 NOTE — Telephone Encounter (Signed)
-----   Message from Pieter Partridge, DO sent at 03/31/2015  6:33 PM EST ----- Lab is normal

## 2015-04-01 NOTE — Telephone Encounter (Signed)
Message relayed to patient via vm message.

## 2015-04-13 ENCOUNTER — Ambulatory Visit (HOSPITAL_COMMUNITY)
Admission: RE | Admit: 2015-04-13 | Discharge: 2015-04-13 | Disposition: A | Discharge status: Home / Self Care | Payer: BLUE CROSS/BLUE SHIELD | Source: Ambulatory Visit | Attending: Neurology | Admitting: Neurology

## 2015-04-13 ENCOUNTER — Telehealth: Payer: Self-pay

## 2015-04-13 DIAGNOSIS — G43109 Migraine with aura, not intractable, without status migrainosus: Secondary | ICD-10-CM | POA: Diagnosis not present

## 2015-04-13 DIAGNOSIS — R4701 Aphasia: Secondary | ICD-10-CM | POA: Diagnosis present

## 2015-04-13 DIAGNOSIS — R42 Dizziness and giddiness: Secondary | ICD-10-CM

## 2015-04-13 NOTE — Telephone Encounter (Signed)
Message relayed to patient. Verbalized understanding and denied questions.   

## 2015-04-13 NOTE — Telephone Encounter (Signed)
-----   Message from Pieter Partridge, DO sent at 04/13/2015 12:14 PM EST ----- MRI of brain looks unremarkable.  I think his symptoms were migraine related.

## 2015-04-24 ENCOUNTER — Ambulatory Visit: Payer: Self-pay | Admitting: Neurology

## 2015-04-28 ENCOUNTER — Other Ambulatory Visit: Payer: Self-pay | Admitting: *Deleted

## 2015-04-28 ENCOUNTER — Telehealth: Payer: Self-pay | Admitting: Neurology

## 2015-04-28 MED ORDER — NORTRIPTYLINE HCL 25 MG PO CAPS: 25.0000 mg | ORAL_CAPSULE | Freq: Every day | ORAL | Status: DC

## 2015-04-28 NOTE — Telephone Encounter (Signed)
Rx sent 

## 2015-04-28 NOTE — Telephone Encounter (Signed)
Increase nortriptyline to 25mg  at bedtime and he should update [place] in 4 weeks.

## 2015-04-28 NOTE — Telephone Encounter (Signed)
Patient would like for you to increase the dose.  Please advise.

## 2015-04-28 NOTE — Telephone Encounter (Signed)
Patient called stating he was to call Corey Skains back today about the meds and let her know how they were working. He only has one pill left and needs a call back today. He had some bad headaches last week, but hasn't had Any for three days now.  His suggestion as the patient is a higher dose.  Please call at [phone].

## 2015-05-28 DIAGNOSIS — E782 Mixed hyperlipidemia: Secondary | ICD-10-CM | POA: Diagnosis not present

## 2015-05-28 DIAGNOSIS — I10 Essential (primary) hypertension: Secondary | ICD-10-CM | POA: Diagnosis not present

## 2015-05-28 DIAGNOSIS — G894 Chronic pain syndrome: Secondary | ICD-10-CM | POA: Diagnosis not present

## 2015-05-28 DIAGNOSIS — G4700 Insomnia, unspecified: Secondary | ICD-10-CM | POA: Diagnosis not present

## 2015-07-31 ENCOUNTER — Encounter: Payer: Self-pay | Admitting: Neurology

## 2015-07-31 ENCOUNTER — Ambulatory Visit (INDEPENDENT_AMBULATORY_CARE_PROVIDER_SITE_OTHER): Payer: BLUE CROSS/BLUE SHIELD | Admitting: Neurology

## 2015-07-31 VITALS — BP 140/88 | HR 60 | Ht 71.0 in | Wt 226.0 lb

## 2015-07-31 DIAGNOSIS — G43109 Migraine with aura, not intractable, without status migrainosus: Secondary | ICD-10-CM

## 2015-07-31 MED ORDER — NORTRIPTYLINE HCL 25 MG PO CAPS: 25.0000 mg | ORAL_CAPSULE | Freq: Every day | ORAL | Status: DC

## 2015-07-31 NOTE — Patient Instructions (Signed)
1.  Continue nortriptyline 25mg  at bedtime 2.  Continue magnesium, riboflavin, and coenzyme Q10 3.  Follow up in 6 months.

## 2015-07-31 NOTE — Progress Notes (Signed)
Chart forwarded.  

## 2015-07-31 NOTE — Progress Notes (Signed)
NEUROLOGY FOLLOW UP OFFICE NOTE  DAMIUS KNICELY [id number]  HISTORY OF PRESENT ILLNESS: Nathan Moon is a 65 year old right-handed male with hypertension, hyperlipidemia, arthritis and history of gastric ulcer and kidney stone who follows up for complicated migraine  UPDATE: Sed Rate was 6.  MRI of brain performed 04/13/15 was personally reviewed and was normal.  Since last visit, only one migraine with aphasia at end of March.  It was about 8/10 intensity and lasted 2 hours.  He laid down and it resolved.  Current NSAIDS:  no Current analgesics:  no Current triptans:  no Current anti-emetic:  no Current muscle relaxants:  no Current anti-anxiolytic:  no Current sleep aide:  Ambien Current Antihypertensive medications:  Amlodipine, metoprolol Current Antidepressant medications:  nortriptyline 25mg  Current Anticonvulsant medications:  no Current Vitamins/Herbal/Supplements:  Magnesium, riboflavin, coenzyme Q10 Current Antihistamines/Decongestants:  no Other therapy:  no  PAST MEDICAL HISTORY: Past Medical History  Diagnosis Date  . Hyperlipidemia   . Hypertension   . Arthritis   . Gastric ulcer     bleeding ulcer  . Blood transfusion without reported diagnosis   . Pneumonia     hx of  . Diverticulosis   . Hernia, incisional     right  . Rash of neck     MEDICATIONS: Current Outpatient Prescriptions on File Prior to Visit  Medication Sig Dispense Refill  . amLODipine (NORVASC) 5 MG tablet Take 5 mg by mouth daily.     Marland Kitchen atorvastatin (LIPITOR) 40 MG tablet Take 40 mg by mouth daily.     . Fenofibric Acid 105 MG TABS Take 105 mg by mouth daily.     Marland Kitchen FIBER PO Take 3-4 capsules by mouth daily.    . metoprolol succinate (TOPROL-XL) 25 MG 24 hr tablet Take 25 mg by mouth daily.    . Multiple Vitamins-Minerals (MULTIVITAMIN PO) Take 1 tablet by mouth daily.    . Probiotic Product (PROBIOTIC DAILY PO) Take 1 capsule by mouth daily.    Marland Kitchen zolpidem (AMBIEN) 10 MG  tablet Take 10 mg by mouth at bedtime as needed for sleep.     No current facility-administered medications on file prior to visit.    ALLERGIES: No Known Allergies  FAMILY HISTORY: Family History  Problem Relation Age of Onset  . Diabetes Mother   . Arthritis Mother   . Heart disease Mother   . Vision loss Mother   . Arthritis Father   . Heart disease Father     SOCIAL HISTORY: Social History   Social History  . Marital Status: Divorced    Spouse Name: N/A  . Number of Children: N/A  . Years of Education: N/A   Occupational History  . Not on file.   Social History Main Topics  . Smoking status: Never Smoker   . Smokeless tobacco: Never Used  . Alcohol Use: Yes     Comment: 2-3 glasses of wine a week  . Drug Use: No  . Sexual Activity: Not on file   Other Topics Concern  . Not on file   Social History Narrative    REVIEW OF SYSTEMS: Constitutional: No fevers, chills, or sweats, no generalized fatigue, change in appetite Eyes: No visual changes, double vision, eye pain Ear, nose and throat: No hearing loss, ear pain, nasal congestion, sore throat Cardiovascular: No chest pain, palpitations Respiratory:  No shortness of breath at rest or with exertion, wheezes GastrointestinaI: No nausea, vomiting, diarrhea, abdominal pain, fecal incontinence Genitourinary:  No dysuria, urinary retention or frequency Musculoskeletal:  No neck pain, back pain Integumentary: No rash, pruritus, skin lesions Neurological: as above Psychiatric: No depression, insomnia, anxiety Endocrine: No palpitations, fatigue, diaphoresis, mood swings, change in appetite, change in weight, increased thirst Hematologic/Lymphatic:  No purpura, petechiae. Allergic/Immunologic: no itchy/runny eyes, nasal congestion, recent allergic reactions, rashes  PHYSICAL EXAM: Filed Vitals:   07/31/15 0858  BP: 140/88  Pulse: 60   General: No acute distress.  Patient appears well-groomed.  normal body  habitus. Head:  Normocephalic/atraumatic Eyes:  Fundi examined but not visualized Neck: supple, no paraspinal tenderness, full range of motion Heart:  Regular rate and rhythm Lungs:  Clear to auscultation bilaterally Back: No paraspinal tenderness Neurological Exam: alert and oriented to person, place, and time. Attention span and concentration intact, recent and remote memory intact, fund of knowledge intact.  Speech fluent and not dysarthric, language intact.  CN II-XII intact. Bulk and tone normal, muscle strength 5/5 throughout.  Sensation to light touch, temperature and vibration intact.  Deep tendon reflexes 2+ throughout, toes downgoing.  Finger to nose and heel to shin testing intact.  Gait normal, Romberg negative.  IMPRESSION: Migraine without aura and with aura presenting as aphasia  PLAN: 1.  Nortriptyline 25mg  at bedtime 2.  Magnesium, riboflavin and coenzyme Q10 3.  Follow up in 6 months.  15 minutes spent face to face with patient, over 50% spent discussing management.  Metta Clines, DO  CC:  Lysle Rubens, PA-C

## 2015-11-11 ENCOUNTER — Other Ambulatory Visit: Payer: Self-pay

## 2015-11-11 MED ORDER — NORTRIPTYLINE HCL 25 MG PO CAPS: 25.0000 mg | ORAL_CAPSULE | Freq: Every day | ORAL | 1 refills | Status: DC

## 2015-12-07 DIAGNOSIS — Z23 Encounter for immunization: Secondary | ICD-10-CM | POA: Diagnosis not present

## 2015-12-07 DIAGNOSIS — G4700 Insomnia, unspecified: Secondary | ICD-10-CM | POA: Diagnosis not present

## 2015-12-07 DIAGNOSIS — E782 Mixed hyperlipidemia: Secondary | ICD-10-CM | POA: Diagnosis not present

## 2015-12-07 DIAGNOSIS — I10 Essential (primary) hypertension: Secondary | ICD-10-CM | POA: Diagnosis not present

## 2016-02-05 ENCOUNTER — Encounter: Payer: Self-pay | Admitting: Neurology

## 2016-02-05 ENCOUNTER — Ambulatory Visit (INDEPENDENT_AMBULATORY_CARE_PROVIDER_SITE_OTHER): Payer: BLUE CROSS/BLUE SHIELD | Admitting: Neurology

## 2016-02-05 VITALS — BP 132/86 | HR 70 | Ht 71.0 in | Wt 221.0 lb

## 2016-02-05 DIAGNOSIS — G43109 Migraine with aura, not intractable, without status migrainosus: Secondary | ICD-10-CM

## 2016-02-05 NOTE — Progress Notes (Signed)
NEUROLOGY FOLLOW UP OFFICE NOTE  ASHISH KHOURI [id number]  HISTORY OF PRESENT ILLNESS: Nathan Moon is a 65 year old right-handed male with hypertension, hyperlipidemia, arthritis and history of gastric ulcer and kidney stone who follows up for complicated migraine   UPDATE: He is doing well.  He had one habitual spell about 4 months ago.     Current NSAIDS:  no Current analgesics:  no Current triptans:  no Current anti-emetic:  no Current muscle relaxants:  no Current anti-anxiolytic:  no Current sleep aide:  Ambien Current Antihypertensive medications:  Amlodipine, metoprolol Current Antidepressant medications:  nortriptyline 25mg  Current Anticonvulsant medications:  no Current Vitamins/Herbal/Supplements:  Magnesium, riboflavin, coenzyme Q10 Current Antihistamines/Decongestants:  no Other therapy:  no  HISTORY: In late January 2017, he began experiencing headaches.  They are right temporal and sharp.  They are associated with nausea, photophobia and phonophobia.  They would last 2 hours with Excedrin Migraine and would occur frequently (possibly daily).  At the same time, he began having dizzy spells as well.  If he gets up or changes head position too fast, then he would get spinning sensation and have to hold on to something.  It would last a few seconds.  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  He was unable to get words out.  He also noted that he couldn't understand or process content of what people were saying to him.  Marland Kitchen  He presented to the ED for further evaluation.  CBC and BMP were unremarkable.  Troponins were negative.  EKG showed sinus rhythm of 60 bpm.  CT of head showed no acute intracranial process.  After evaluation by neurology, it was determined that he likely has a complicated migraine and was treated with a headache cocktail of Compazine, Benadryl and Toradol.  Sed Rate was 6.  MRI of brain performed 04/13/15  was personally reviewed and was normal.   He does report remote history of migraines as a child and has a migraine very rarely.  PAST MEDICAL HISTORY: Past Medical History:  Diagnosis Date  . Arthritis   . Blood transfusion without reported diagnosis   . Diverticulosis   . Gastric ulcer    bleeding ulcer  . Hernia, incisional    right  . Hyperlipidemia   . Hypertension   . Pneumonia    hx of  . Rash of neck     MEDICATIONS: Current Outpatient Prescriptions on File Prior to Visit  Medication Sig Dispense Refill  . amLODipine (NORVASC) 5 MG tablet Take 5 mg by mouth daily.     Marland Kitchen atorvastatin (LIPITOR) 40 MG tablet Take 40 mg by mouth daily.     . Fenofibric Acid 105 MG TABS Take 105 mg by mouth daily.     Marland Kitchen FIBER PO Take 3-4 capsules by mouth daily.    . metoprolol succinate (TOPROL-XL) 25 MG 24 hr tablet Take 25 mg by mouth daily.    . Multiple Vitamins-Minerals (MULTIVITAMIN PO) Take 1 tablet by mouth daily.    . nortriptyline (PAMELOR) 25 MG capsule Take 1 capsule (25 mg total) by mouth at bedtime. 90 capsule 1  . Probiotic Product (PROBIOTIC DAILY PO) Take 1 capsule by mouth daily.    Marland Kitchen zolpidem (AMBIEN) 10 MG tablet Take 10 mg by mouth at bedtime as needed for sleep.     No current facility-administered medications on file prior to visit.  ALLERGIES: No Known Allergies  FAMILY HISTORY: Family History  Problem Relation Age of Onset  . Diabetes Mother   . Arthritis Mother   . Heart disease Mother   . Vision loss Mother   . Arthritis Father   . Heart disease Father     SOCIAL HISTORY: Social History   Social History  . Marital status: Divorced    Spouse name: N/A  . Number of children: N/A  . Years of education: N/A   Occupational History  . Not on file.   Social History Main Topics  . Smoking status: Never Smoker  . Smokeless tobacco: Never Used  . Alcohol use Yes     Comment: 2-3 glasses of wine a week  . Drug use: No  . Sexual activity: Not  on file   Other Topics Concern  . Not on file   Social History Narrative  . No narrative on file    REVIEW OF SYSTEMS: Constitutional: No fevers, chills, or sweats, no generalized fatigue, change in appetite Eyes: No visual changes, double vision, eye pain Ear, nose and throat: No hearing loss, ear pain, nasal congestion, sore throat Cardiovascular: No chest pain, palpitations Respiratory:  No shortness of breath at rest or with exertion, wheezes GastrointestinaI: No nausea, vomiting, diarrhea, abdominal pain, fecal incontinence Genitourinary:  No dysuria, urinary retention or frequency Musculoskeletal:  No neck pain, back pain Integumentary: No rash, pruritus, skin lesions Neurological: as above Psychiatric: No depression, insomnia, anxiety Endocrine: No palpitations, fatigue, diaphoresis, mood swings, change in appetite, change in weight, increased thirst Hematologic/Lymphatic:  No purpura, petechiae. Allergic/Immunologic: no itchy/runny eyes, nasal congestion, recent allergic reactions, rashes  PHYSICAL EXAM: Vitals:   02/05/16 0737  BP: 132/86  Pulse: 70   General: No acute distress.  Patient appears well-groomed.  normal body habitus. Head:  Normocephalic/atraumatic Eyes:  Fundi examined but not visualized Neck: supple, no paraspinal tenderness, full range of motion Heart:  Regular rate and rhythm Lungs:  Clear to auscultation bilaterally Back: No paraspinal tenderness Neurological Exam: alert and oriented to person, place, and time. Attention span and concentration intact, recent and remote memory intact, fund of knowledge intact.  Speech fluent and not dysarthric, language intact.  CN II-XII intact. Bulk and tone normal, muscle strength 5/5 throughout.  Sensation to light touch  intact.  Deep tendon reflexes 2+ throughout.  Finger to nose testing intact.  Gait normal  IMPRESSION: Migraine without aura and with aura presenting as aphasia  PLAN: Will continue  nortriptyline 25mg  at bedtime Follow up in one year.  15 minutes spent face to face with patient, over 50% spent discussing current status (as far as the migraines are concerned) as well as continued management.   Metta Clines, DO  CC:  Corine Shelter, PA-C

## 2016-04-16 DIAGNOSIS — J019 Acute sinusitis, unspecified: Secondary | ICD-10-CM | POA: Diagnosis not present

## 2016-04-26 DIAGNOSIS — J019 Acute sinusitis, unspecified: Secondary | ICD-10-CM | POA: Diagnosis not present

## 2016-06-07 DIAGNOSIS — Z791 Long term (current) use of non-steroidal anti-inflammatories (NSAID): Secondary | ICD-10-CM | POA: Diagnosis not present

## 2016-06-07 DIAGNOSIS — G4700 Insomnia, unspecified: Secondary | ICD-10-CM | POA: Diagnosis not present

## 2016-06-07 DIAGNOSIS — I10 Essential (primary) hypertension: Secondary | ICD-10-CM | POA: Diagnosis not present

## 2016-06-07 DIAGNOSIS — E782 Mixed hyperlipidemia: Secondary | ICD-10-CM | POA: Diagnosis not present

## 2016-06-07 DIAGNOSIS — M25551 Pain in right hip: Secondary | ICD-10-CM | POA: Diagnosis not present

## 2016-06-15 ENCOUNTER — Other Ambulatory Visit: Payer: Self-pay | Admitting: Neurology

## 2016-09-20 ENCOUNTER — Emergency Department (HOSPITAL_BASED_OUTPATIENT_CLINIC_OR_DEPARTMENT_OTHER): Payer: BLUE CROSS/BLUE SHIELD

## 2016-09-20 ENCOUNTER — Encounter (HOSPITAL_BASED_OUTPATIENT_CLINIC_OR_DEPARTMENT_OTHER): Payer: Self-pay | Admitting: *Deleted

## 2016-09-20 ENCOUNTER — Emergency Department (HOSPITAL_BASED_OUTPATIENT_CLINIC_OR_DEPARTMENT_OTHER)
Admission: EM | Admit: 2016-09-20 | Discharge: 2016-09-20 | Disposition: A | Discharge status: Home / Self Care | Payer: BLUE CROSS/BLUE SHIELD | Attending: Emergency Medicine | Admitting: Emergency Medicine

## 2016-09-20 DIAGNOSIS — G43909 Migraine, unspecified, not intractable, without status migrainosus: Secondary | ICD-10-CM | POA: Diagnosis present

## 2016-09-20 DIAGNOSIS — K219 Gastro-esophageal reflux disease without esophagitis: Secondary | ICD-10-CM

## 2016-09-20 DIAGNOSIS — J0100 Acute maxillary sinusitis, unspecified: Secondary | ICD-10-CM | POA: Diagnosis not present

## 2016-09-20 DIAGNOSIS — I700 Atherosclerosis of aorta: Secondary | ICD-10-CM | POA: Diagnosis not present

## 2016-09-20 LAB — BASIC METABOLIC PANEL
Anion gap: 7 (ref 5–15)
BUN: 22 mg/dL — ABNORMAL HIGH (ref 6–20)
CHLORIDE: 108 mmol/L (ref 101–111)
CO2: 26 mmol/L (ref 22–32)
Calcium: 9.4 mg/dL (ref 8.9–10.3)
Creatinine, Ser: 0.96 mg/dL (ref 0.61–1.24)
Glucose, Bld: 97 mg/dL (ref 65–99)
POTASSIUM: 3.8 mmol/L (ref 3.5–5.1)
SODIUM: 141 mmol/L (ref 135–145)

## 2016-09-20 LAB — CBC
HEMATOCRIT: 42.6 % (ref 39.0–52.0)
Hemoglobin: 15 g/dL (ref 13.0–17.0)
MCH: 30.4 pg (ref 26.0–34.0)
MCHC: 35.2 g/dL (ref 30.0–36.0)
MCV: 86.2 fL (ref 78.0–100.0)
Platelets: 241 10*3/uL (ref 150–400)
RBC: 4.94 MIL/uL (ref 4.22–5.81)
RDW: 13.2 % (ref 11.5–15.5)
WBC: 3.7 10*3/uL — AB (ref 4.0–10.5)

## 2016-09-20 LAB — TROPONIN I: Troponin I: <0.03 ng/mL (ref <0.03)

## 2016-09-20 MED ORDER — FAMOTIDINE 20 MG PO TABS: 20.0000 mg | ORAL_TABLET | Freq: Two times a day (BID) | ORAL | 0 refills | Status: DC

## 2016-09-20 MED ORDER — GI COCKTAIL ~~LOC~~
30.0000 mL | Freq: Once | ORAL | Status: AC
  Administered 2016-09-20: 30 mL via ORAL
  Filled 2016-09-20: qty 30

## 2016-09-20 MED ORDER — SUCRALFATE 1 G PO TABS: 1.0000 g | ORAL_TABLET | Freq: Three times a day (TID) | ORAL | 0 refills | Status: DC

## 2016-09-20 MED FILL — SUCRALFATE 1 GM TABLET: 1 | 10 days supply | Qty: 30 | Fill #0

## 2016-09-20 MED FILL — HEARTBURN RELIEF TABLET: 10 | 15 days supply | Qty: 60 | Fill #0

## 2016-09-20 NOTE — ED Provider Notes (Signed)
[facility] Provider Note   CSN: [id number] Arrival date & time: 09/20/16  7741     History   Chief Complaint Chief Complaint  Patient presents with  . Chest Pain    HPI Nathan Moon is a 66 y.o. male.  HPI   Patient is a 66 year old male with history of hypertension, hyperlipidemia, arthritis, gastric ulcer who presents to the ED with complaint of chest pain, onset one week. Patient states over the past week he has had intermittent episodes of chest pressure to his mid lower chest and upper abdomen. He reports his chest pain is typically worse after eating. Denies any alleviating factors. Denies radiation. He states he has taken Zantac and Pepto-Bismol at home without relief. Denies having similar episodes of chest pain in the past. He also notes he has had mild nasal congestion, nonproductive cough and mild wheezing for the past few days. Denies fever, chills, hemoptysis, shortness of breath, palpitations, nausea, vomiting, numbness, tingling, weakness, leg swelling. Denies personal history of cardiac disease but endorses positive family history. Denies smoking. Patient reports taking an aspirin prior to arrival without improvement of symptoms.  Past Medical History:  Diagnosis Date  . Arthritis   . Blood transfusion without reported diagnosis   . Diverticulosis   . Gastric ulcer    bleeding ulcer  . Hernia, incisional    right  . Hyperlipidemia   . Hypertension   . Pneumonia    hx of  . Rash of neck     Patient Active Problem List   Diagnosis Date Noted  . Symptomatic cholelithiasis 08/28/2013  . Dehydration 11/23/2012  . Acute renal failure ([place]) 11/23/2012  . HTN (hypertension) 11/23/2012  . Elevated hemoglobin ([place]) 11/23/2012  . Syncope and collapse 11/23/2012    Past Surgical History:  Procedure Laterality Date  . APPENDECTOMY  1968  . BACK SURGERY  2003,2006,2008   Lumbar Disectomy, Cervical Fusion, Lumbar Fusion  . CHOLECYSTECTOMY N/A  09/19/2013   Procedure: LAPAROSCOPIC CHOLECYSTECTOMY;  Surgeon: Harl Bowie, MD;  Location: [facility];  Service: General;  Laterality: N/A;  . COLON SURGERY  1980   Intestinal Blockage  . COLONOSCOPY WITH ESOPHAGOGASTRODUODENOSCOPY (EGD)    . HERNIA REPAIR  1988   Lt side  . HERNIA REPAIR  2010   Rt side  . SHOULDER SURGERY         Home Medications    Prior to Admission medications   Medication Sig Start Date End Date Taking? Authorizing Provider  amLODipine (NORVASC) 5 MG tablet Take 5 mg by mouth daily.  08/21/13  Yes [provider]  atorvastatin (LIPITOR) 40 MG tablet Take 40 mg by mouth daily.    Yes [provider]  Fenofibric Acid 105 MG TABS Take 105 mg by mouth daily.  07/26/13  Yes [provider]  FIBER PO Take 3-4 capsules by mouth daily.   Yes [provider]  metoprolol succinate (TOPROL-XL) 25 MG 24 hr tablet Take 25 mg by mouth daily.   Yes [provider]  Multiple Vitamins-Minerals (MULTIVITAMIN PO) Take 1 tablet by mouth daily.   Yes [provider]  nortriptyline (PAMELOR) 25 MG capsule TAKE ONE CAPSULE BY MOUTH AT BEDTIME 06/15/16  Yes Jaffe, Adam R, DO  Probiotic Product (PROBIOTIC DAILY PO) Take 1 capsule by mouth daily.   Yes [provider]  zolpidem (AMBIEN) 10 MG tablet Take 10 mg by mouth at bedtime as needed for sleep.   Yes [provider]  famotidine (PEPCID) 20 MG tablet Take 1 tablet (20 mg total) by mouth 2 (two) times daily. 09/20/16   Nona Dell, PA-C  sucralfate (CARAFATE) 1 g tablet Take 1 tablet (1 g total) by mouth 3 (three) times daily with meals. 09/20/16   Nona Dell, PA-C    Family History Family History  Problem Relation Age of Onset  . Diabetes Mother   . Arthritis Mother   . Heart disease Mother   . Vision loss Mother   . Arthritis Father   . Heart disease Father     Social History Social History  Substance Use Topics  . Smoking  status: Never Smoker  . Smokeless tobacco: Never Used  . Alcohol use Yes     Comment: 2-3 glasses of wine a week     Allergies   Patient has no known allergies.   Review of Systems Review of Systems  HENT: Positive for congestion.   Respiratory: Positive for cough and wheezing.   Cardiovascular: Positive for chest pain.  Gastrointestinal: Positive for abdominal pain.  All other systems reviewed and are negative.    Physical Exam Updated Vital Signs BP (!) 157/107   Pulse 81   Temp 98.2 F (36.8 C) (Oral)   Resp 18   Ht 5\' 11"  (1.803 m)   Wt 97.5 kg (215 lb)   SpO2 96%   BMI 29.99 kg/m   Physical Exam  Constitutional: He is oriented to person, place, and time. He appears well-developed and well-nourished. No distress.  HENT:  Head: Normocephalic and atraumatic.  Mouth/Throat: Oropharynx is clear and moist. No oropharyngeal exudate.  Eyes: Conjunctivae and EOM are normal. Right eye exhibits no discharge. Left eye exhibits no discharge. No scleral icterus.  Neck: Normal range of motion. Neck supple.  Cardiovascular: Normal rate, regular rhythm, normal heart sounds and intact distal pulses.   Pulmonary/Chest: Effort normal and breath sounds normal. No respiratory distress. He has no wheezes. He has no rales. He exhibits no tenderness.  Abdominal: Soft. Normal appearance and bowel sounds are normal. He exhibits no distension and no mass. There is tenderness. There is no rigidity, no rebound and no guarding. No hernia.  Mild TTP over epigastric region  Musculoskeletal: Normal range of motion. He exhibits no edema.  Neurological: He is alert and oriented to person, place, and time.  Skin: Skin is warm and dry. He is not diaphoretic.  Nursing note and vitals reviewed.    ED Treatments / Results  Labs (all labs ordered are listed, but only abnormal results are displayed) Labs Reviewed  BASIC METABOLIC PANEL - Abnormal; Notable for the following:       Result Value    BUN 22 (*)    All other components within normal limits  CBC - Abnormal; Notable for the following:    WBC 3.7 (*)    All other components within normal limits  TROPONIN I    EKG  EKG Interpretation  Date/Time:  Tuesday September 20 2016 09:27:30 EDT Ventricular Rate:  76 PR Interval:    QRS Duration: 114 QT Interval:  396 QTC Calculation: 446 R Axis:   50 Text Interpretation:  Sinus rhythm Borderline intraventricular conduction delay No signficant change since 03/27/2015 Confirmed by Veryl Speak [phone]) on 09/20/2016 9:36:32 AM       Radiology Dg Chest 2 View  Result Date: 09/20/2016 CLINICAL DATA:  A week of intermittent chest discomfort with persistent discomfort since 4 a.m. today. There is associated nausea and  diaphoresis. EXAM: CHEST  2 VIEW COMPARISON:  Chest x-ray of March 27, 2015 FINDINGS: The lungs are adequately inflated and clear. The heart and pulmonary vascularity are normal. The mediastinum is normal in width. The trachea is midline. There is no pleural effusion. There is calcification in the wall of the aortic arch. There is mild multilevel degenerative disc disease of the thoracic spine. IMPRESSION: There is no pneumonia nor other acute cardiopulmonary abnormality. Thoracic aortic atherosclerosis. Electronically Signed   By: David  Martinique M.D.   On: 09/20/2016 09:53    Procedures Procedures (including critical care time)  Medications Ordered in ED Medications  gi cocktail (Maalox,Lidocaine,Donnatal) (30 mLs Oral Given 09/20/16 1032)     Initial Impression / Assessment and Plan / ED Course  I have reviewed the triage vital signs and the nursing notes.  Pertinent labs & imaging results that were available during my care of the patient were reviewed by me and considered in my medical decision making (see chart for details).     Patient presents with intermittent midsternal CP/epigastric pain described as tightness for the past week, reports pain is worse after  eating. Also reports having mild congestion, cough and wheezing for the past few days. VSS. Exam revealed mild tenderness over epigastric region, no peritoneal signs. Remaining exam unremarkable. EKG showed sinus rhythm with no acute ischemic changes. Troponin negative. Labs unremarkable. Chest x-ray negative. Patient given GI cocktail and the ED. On reevaluation patient reports complete resolution of symptoms. Denies any pain or complaints at this time. Suspect patient's symptoms are likely due to acid reflux versus gastritis. I have a low suspicion for ACS, PE, dissection, or other acute cardiac event at this time. Discussed results and plan for discharge with patient. Plan to discharge patient home with symptomatic treatment and peak T follow-up. Discussed return precautions.  Final Clinical Impressions(s) / ED Diagnoses   Final diagnoses:  Gastroesophageal reflux disease, esophagitis presence not specified    New Prescriptions New Prescriptions   FAMOTIDINE (PEPCID) 20 MG TABLET    Take 1 tablet (20 mg total) by mouth 2 (two) times daily.   SUCRALFATE (CARAFATE) 1 G TABLET    Take 1 tablet (1 g total) by mouth 3 (three) times daily with meals.     Nona Dell, PA-C 09/20/16 1105    Veryl Speak, MD 09/20/16 1153

## 2016-09-20 NOTE — ED Notes (Signed)
Pt directed to pharmacy to pick up Rx 

## 2016-09-20 NOTE — ED Notes (Signed)
ED Provider at bedside. EDPA Modena Jansky

## 2016-09-20 NOTE — Discharge Instructions (Signed)
Take your medications as prescribed. Follow-up with your primary care provider in the next week if your symptoms have not improved. Please return to the Emergency Department if symptoms worsen or new onset of fever, new/worsening chest pain, shortness of breath, abdominal pain, vomiting, blood in vomit.

## 2016-09-20 NOTE — ED Triage Notes (Signed)
Pt reports intermittent chest pain x 1 week. Current episode started last night. C/o nausea and sweating

## 2016-09-27 DIAGNOSIS — K219 Gastro-esophageal reflux disease without esophagitis: Secondary | ICD-10-CM | POA: Diagnosis not present

## 2016-09-27 DIAGNOSIS — Z09 Encounter for follow-up examination after completed treatment for conditions other than malignant neoplasm: Secondary | ICD-10-CM | POA: Diagnosis not present

## 2016-10-31 DIAGNOSIS — K5730 Diverticulosis of large intestine without perforation or abscess without bleeding: Secondary | ICD-10-CM | POA: Diagnosis not present

## 2016-10-31 DIAGNOSIS — K219 Gastro-esophageal reflux disease without esophagitis: Secondary | ICD-10-CM | POA: Diagnosis not present

## 2016-11-28 DIAGNOSIS — Z86010 Personal history of colonic polyps: Secondary | ICD-10-CM | POA: Diagnosis not present

## 2016-11-28 DIAGNOSIS — K219 Gastro-esophageal reflux disease without esophagitis: Secondary | ICD-10-CM | POA: Diagnosis not present

## 2016-12-07 DIAGNOSIS — Z23 Encounter for immunization: Secondary | ICD-10-CM | POA: Diagnosis not present

## 2016-12-07 DIAGNOSIS — I10 Essential (primary) hypertension: Secondary | ICD-10-CM | POA: Diagnosis not present

## 2016-12-07 DIAGNOSIS — G4700 Insomnia, unspecified: Secondary | ICD-10-CM | POA: Diagnosis not present

## 2016-12-07 DIAGNOSIS — E782 Mixed hyperlipidemia: Secondary | ICD-10-CM | POA: Diagnosis not present

## 2017-02-03 ENCOUNTER — Encounter: Payer: Self-pay | Admitting: Neurology

## 2017-02-03 ENCOUNTER — Ambulatory Visit (INDEPENDENT_AMBULATORY_CARE_PROVIDER_SITE_OTHER): Payer: BLUE CROSS/BLUE SHIELD | Admitting: Neurology

## 2017-02-03 VITALS — BP 140/80 | HR 72 | Ht 71.0 in | Wt 231.0 lb

## 2017-02-03 DIAGNOSIS — G43109 Migraine with aura, not intractable, without status migrainosus: Secondary | ICD-10-CM

## 2017-02-03 NOTE — Patient Instructions (Signed)
Since it has been almost a year and a half since your last migraine, try stopping the nortriptyline because you may not need it anymore.  If you start having recurrent migraines again, let me know and we can restart the nortriptyline.    Make an appointment in one year just in case we need to restart nortriptyline.  If you have been doing well off of nortriptyline over the next year, just call us to cancel the appointment.

## 2017-02-03 NOTE — Progress Notes (Signed)
NEUROLOGY FOLLOW UP OFFICE NOTE  Nathan Moon [id number]  HISTORY OF PRESENT ILLNESS: Nathan Moon is a 66 year old right-handed male with hypertension, hyperlipidemia, arthritis and history of gastric ulcer and kidney stone who follows up for complicated migraine   UPDATE: He has been doing well.  He has not had any severe headaches or migraines since last visit.  Over the past year, he had two brief episodes of mild head pressure, but they never developed into migraine.  His last migraine was during the summer of 2017. Current NSAIDS:  no Current analgesics:  no Current triptans:  no Current anti-emetic:  no Current muscle relaxants:  no Current anti-anxiolytic:  no Current sleep aide:  Ambien Current Antihypertensive medications:  Amlodipine, metoprolol Current Antidepressant medications:  nortriptyline 25mg  Current Anticonvulsant medications:  no Current Vitamins/Herbal/Supplements:  Magnesium, riboflavin, coenzyme Q10 Current Antihistamines/Decongestants:  no Other therapy:  no   HISTORY: In late January 2017, he began experiencing headaches.  They are right temporal and sharp.  They are associated with nausea, photophobia and phonophobia.  They would last 2 hours with Excedrin Migraine and would occur frequently (possibly daily).  At the same time, he began having dizzy spells as well.  If he gets up or changes head position too fast, then he would get spinning sensation and have to hold on to something.  It would last a few seconds.  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  He was unable to get words out.  He also noted that he couldn't understand or process content of what people were saying to him.  .  There was no particular trigger.  He presented to the ED for further evaluation.  CBC and BMP were unremarkable.  Troponins were negative.  EKG showed sinus rhythm of 60 bpm.  CT of head showed no acute intracranial process.   After evaluation by neurology, it was determined that he likely has a complicated migraine and was treated with a headache cocktail of Compazine, Benadryl and Toradol.  Sed Rate was 6.  MRI of brain performed 04/13/15 was personally reviewed and was normal.  It resolved on its own.   He does report remote history of migraines as a child and has a migraine very rarely.  PAST MEDICAL HISTORY: Past Medical History:  Diagnosis Date  . Arthritis   . Blood transfusion without reported diagnosis   . Diverticulosis   . Gastric ulcer    bleeding ulcer  . Hernia, incisional    right  . Hyperlipidemia   . Hypertension   . Pneumonia    hx of  . Rash of neck     MEDICATIONS: Current Outpatient Medications on File Prior to Visit  Medication Sig Dispense Refill  . amLODipine (NORVASC) 5 MG tablet Take 5 mg by mouth daily.     Marland Kitchen atorvastatin (LIPITOR) 40 MG tablet Take 40 mg by mouth daily.     . famotidine (PEPCID) 20 MG tablet Take 1 tablet (20 mg total) by mouth 2 (two) times daily. 30 tablet 0  . Fenofibric Acid 105 MG TABS Take 105 mg by mouth daily.     Marland Kitchen FIBER PO Take 3-4 capsules by mouth daily.    . metoprolol succinate (TOPROL-XL) 25 MG 24 hr tablet Take 25 mg by mouth daily.    . Multiple Vitamins-Minerals (MULTIVITAMIN PO) Take 1 tablet by mouth daily.    . Probiotic Product (PROBIOTIC  DAILY PO) Take 1 capsule by mouth daily.    . sucralfate (CARAFATE) 1 g tablet Take 1 tablet (1 g total) by mouth 3 (three) times daily with meals. 30 tablet 0  . zolpidem (AMBIEN) 10 MG tablet Take 10 mg by mouth at bedtime as needed for sleep.     No current facility-administered medications on file prior to visit.     ALLERGIES: No Known Allergies  FAMILY HISTORY: Family History  Problem Relation Age of Onset  . Diabetes Mother   . Arthritis Mother   . Heart disease Mother   . Vision loss Mother   . Arthritis Father   . Heart disease Father     SOCIAL HISTORY: Social History    Socioeconomic History  . Marital status: Divorced    Spouse name: Not on file  . Number of children: Not on file  . Years of education: Not on file  . Highest education level: Not on file  Social Needs  . Financial resource strain: Not on file  . Food insecurity - worry: Not on file  . Food insecurity - inability: Not on file  . Transportation needs - medical: Not on file  . Transportation needs - non-medical: Not on file  Occupational History  . Not on file  Tobacco Use  . Smoking status: Never Smoker  . Smokeless tobacco: Never Used  Substance and Sexual Activity  . Alcohol use: Yes    Comment: 2-3 glasses of wine a week  . Drug use: No  . Sexual activity: Not on file  Other Topics Concern  . Not on file  Social History Narrative  . Not on file    REVIEW OF SYSTEMS: Constitutional: No fevers, chills, or sweats, no generalized fatigue, change in appetite Eyes: No visual changes, double vision, eye pain Ear, nose and throat: No hearing loss, ear pain, nasal congestion, sore throat Cardiovascular: No chest pain, palpitations Respiratory:  No shortness of breath at rest or with exertion, wheezes GastrointestinaI: No nausea, vomiting, diarrhea, abdominal pain, fecal incontinence Genitourinary:  No dysuria, urinary retention or frequency Musculoskeletal:  No neck pain, back pain Integumentary: No rash, pruritus, skin lesions Neurological: as above Psychiatric: No depression, insomnia, anxiety Endocrine: No palpitations, fatigue, diaphoresis, mood swings, change in appetite, change in weight, increased thirst Hematologic/Lymphatic:  No purpura, petechiae. Allergic/Immunologic: no itchy/runny eyes, nasal congestion, recent allergic reactions, rashes  PHYSICAL EXAM: Vitals:   02/03/17 0724  BP: 140/80  Pulse: 72  SpO2: 96%   General: No acute distress.  Patient appears well-groomed.   Head:  Normocephalic/atraumatic Eyes:  Fundi examined but not visualized Neck:  supple, no paraspinal tenderness, full range of motion Heart:  Regular rate and rhythm Lungs:  Clear to auscultation bilaterally Back: No paraspinal tenderness Neurological Exam: alert and oriented to person, place, and time. Attention span and concentration intact, recent and remote memory intact, fund of knowledge intact.  Speech fluent and not dysarthric, language intact.  CN II-XII intact. Bulk and tone normal, muscle strength 5/5 throughout.  Sensation to light touch  intact.  Deep tendon reflexes 2+ throughout.  Finger to nose testing intact.  Gait normal, Romberg negative.  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    PLAN: He has been migraine-free for almost a year and a half.  Therefore, we will try discontinuing nortriptyline and see how he feels.  If he should have a recurrence of migraines, we can restart the nortriptyline.  He will make a follow up appointment  for one year.  If he is doing well and doesn't need to restart nortriptyline, then he may cancel the appointment.  15 minutes spent face to face with patient, over 50% spent discussing management.  Metta Clines, DO  CC:  Corine Shelter, PA-C

## 2017-02-08 IMAGING — CT CT RENAL STONE PROTOCOL
1 of 2 series · 15 of 32 positions shown, 19 images · non-contrast
Comparison: CT scan dated 11/23/2012

CLINICAL DATA: Left flank pain.

EXAM:
CT ABDOMEN AND PELVIS WITHOUT CONTRAST
TECHNIQUE: Multidetector CT imaging of the abdomen and pelvis was performed
following the standard protocol without IV contrast.

[Series 2: renal stone > 200 lbs 5.0 b31f · axial · 0.75mm/px · z∈[+853,+1283]mm · 15 of 96 slices shown, 19 images]
[im 5/96  soft-tissue]
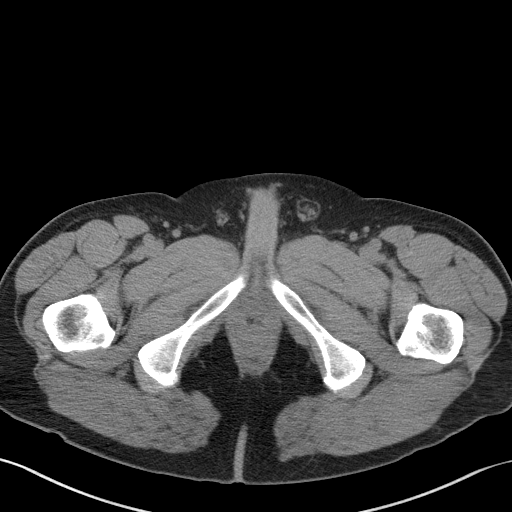
[im 5/96  bone]
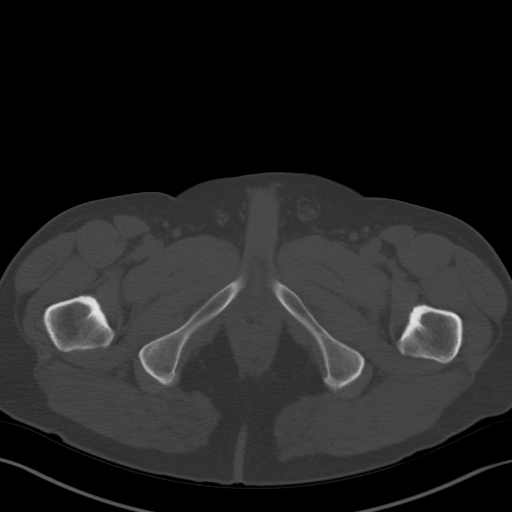
[im 13/96  soft-tissue]
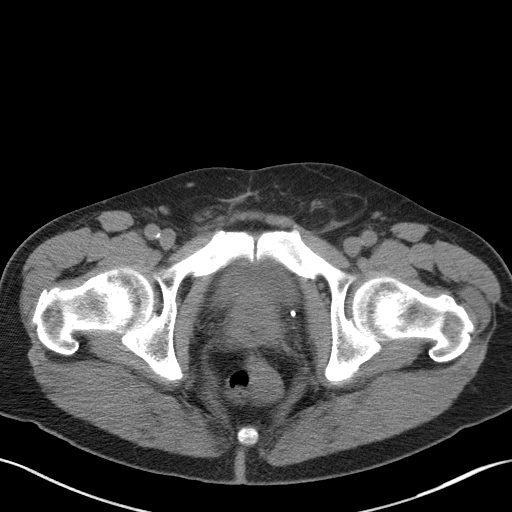
[im 22/96  soft-tissue]
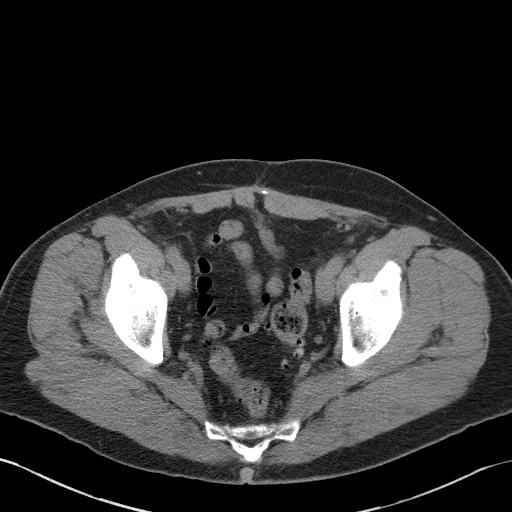
[im 26/96  soft-tissue]
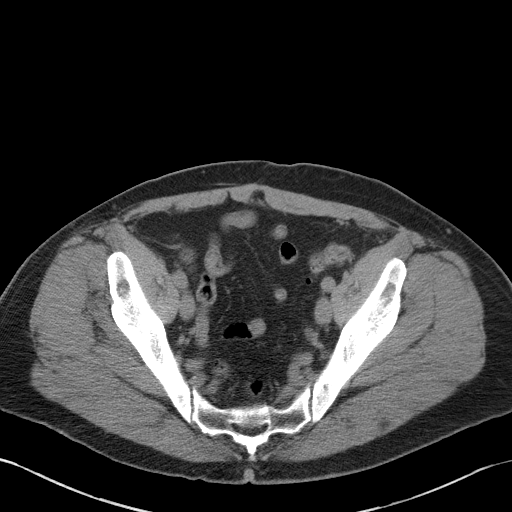
[im 35/96  soft-tissue]
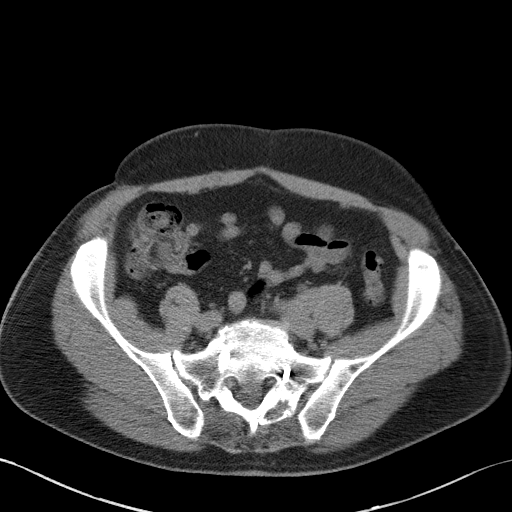
[im 39/96  soft-tissue]
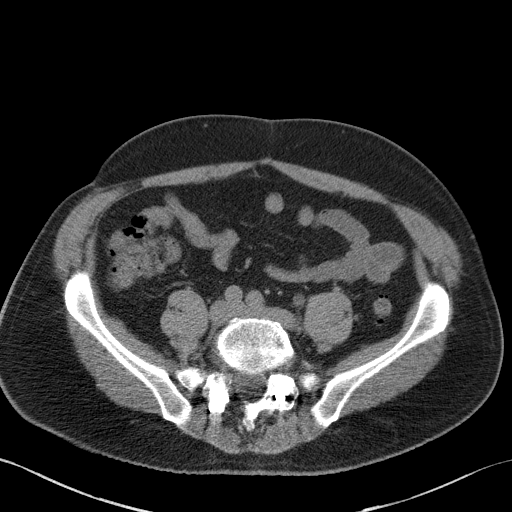
[im 48/96  soft-tissue]
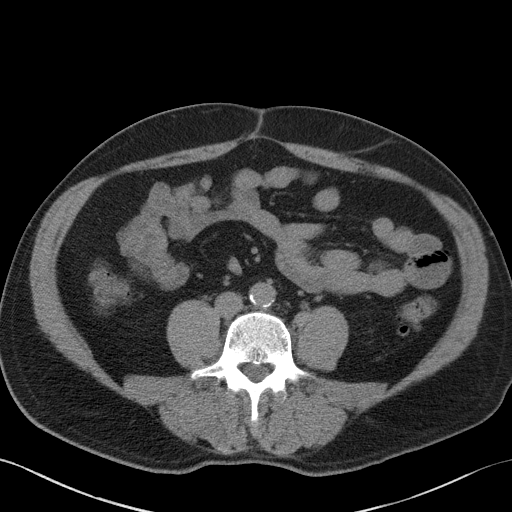
[im 57/96  soft-tissue]
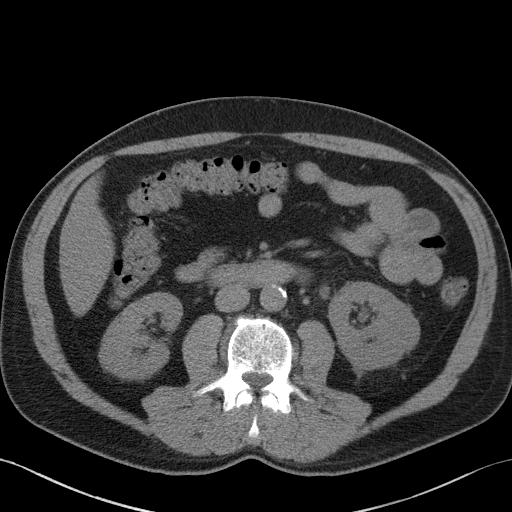
[im 61/96  soft-tissue]
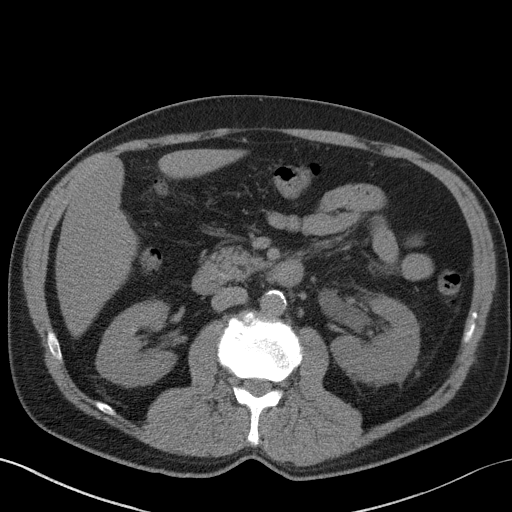
[im 61/96  bone]
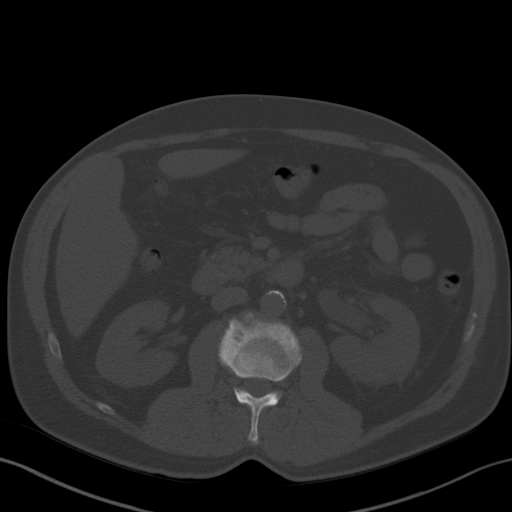
[im 70/96  soft-tissue]
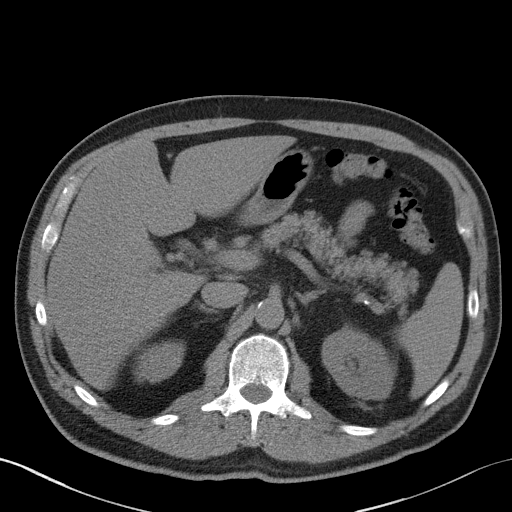
[im 74/96  soft-tissue]
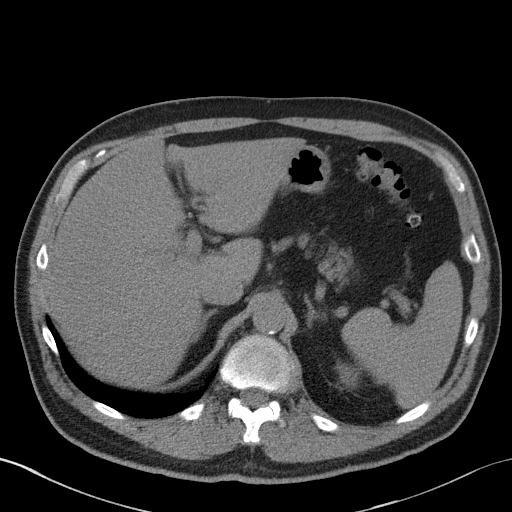
[im 78/96  lung]
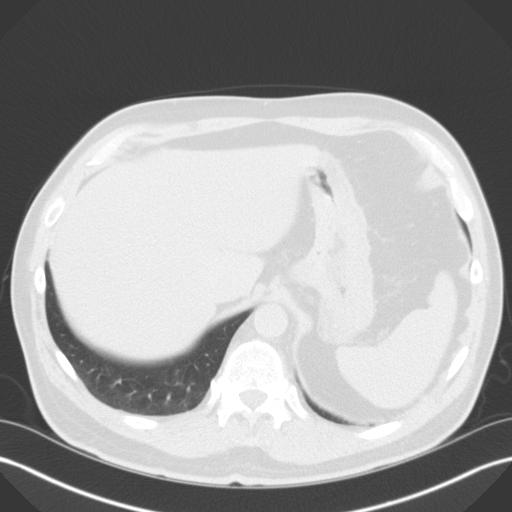
[im 83/96  soft-tissue]
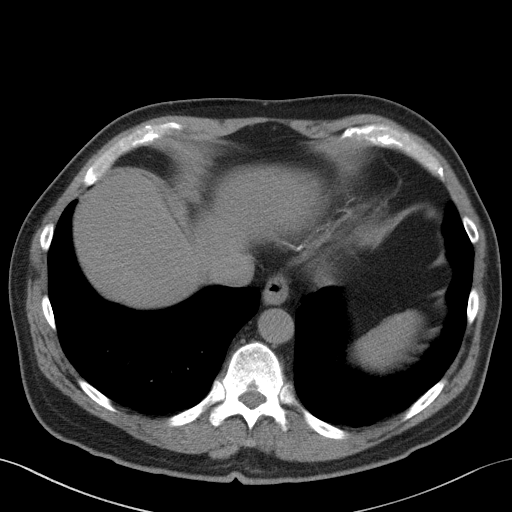
[im 83/96  lung]
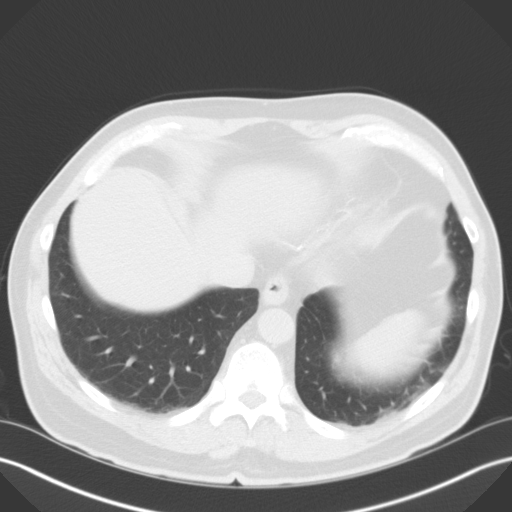
[im 87/96  lung]
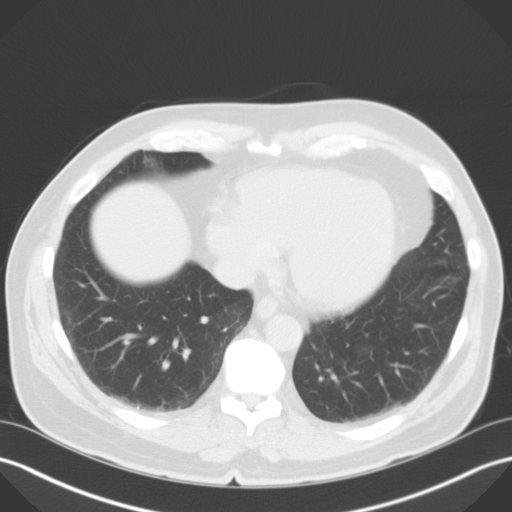
[im 91/96  soft-tissue]
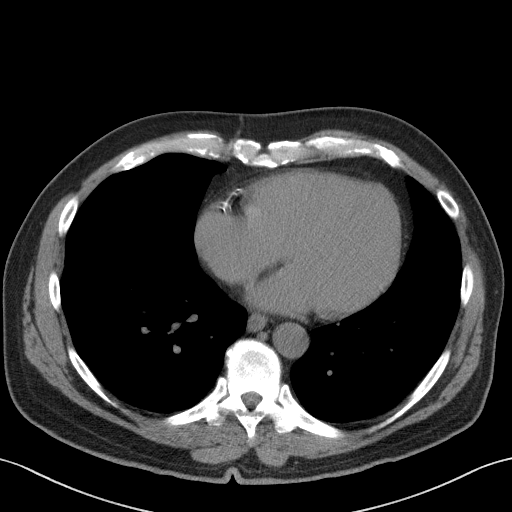
[im 91/96  lung]
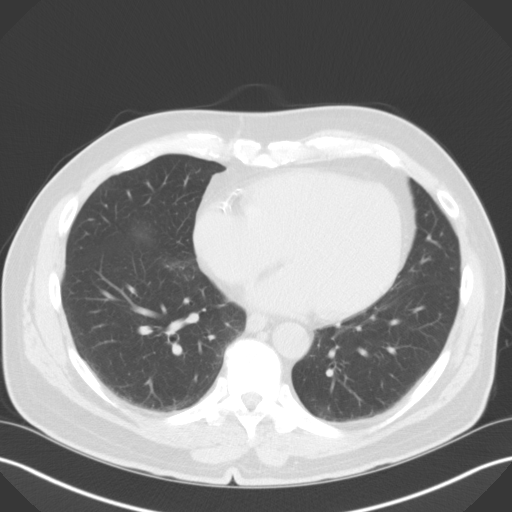

[15 of 32 positions shown; findings below may reference images not displayed]

FINDINGS: There is a 2.3 mm stone obstructing the distal left ureter just
proximal to the ureterovesical junction. This is creating slight
left hydronephrosis. There are several tiny stones in the left
kidney. There is several tiny stones in the otherwise normal right
kidney.

Extensive coronary artery calcification. Lung bases are clear.
Gallbladder has been removed.

Liver, spleen, pancreas, and adrenal glands are normal. No
dilatation of the biliary tree. Scattered diverticula throughout the
colon.

Calcification in the abdominal aorta. Bladder and prostate gland are
normal. Small left inguinal hernia containing only fat. Previous
right inguinal hernia repair.

No acute osseous abnormality.  Previous fusion at L5-S1.
IMPRESSION: 2.3 mm stone obstructing the distal left ureter creating slight left
hydronephrosis. Several tiny bilateral renal stones.

Extensive coronary artery calcification.

## 2017-02-24 ENCOUNTER — Ambulatory Visit: Payer: Self-pay | Admitting: Surgery

## 2017-02-24 DIAGNOSIS — K4090 Unilateral inguinal hernia, without obstruction or gangrene, not specified as recurrent: Secondary | ICD-10-CM | POA: Diagnosis not present

## 2017-02-24 DIAGNOSIS — K5730 Diverticulosis of large intestine without perforation or abscess without bleeding: Secondary | ICD-10-CM | POA: Diagnosis not present

## 2017-02-24 NOTE — H&P (View-Only) (Signed)
Nathan Moon Documented: 02/24/2017 3:22 PM Location: [facility] Patient #: [id number] DOB: 1950/11/19 Divorced / Language: Nathan Moon / Race: White Male  History of Present Illness (Nathan Moon A. Kae Heller MD; 02/24/2017 3:48 PM) Patient words: This is a very pleasant 67 year old man who is well known to our practice status post multiple abdominal surgeries, most recently a laparoscopic cholecystectomy by Dr. Rush Farmer in 2015. He has a left inguinal hernia which has become increasingly symptomatic over the last 4 or 5 days. He feels her there is a knot in his left groin. 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 He states these usually last 3 or 4 days and he doesn't usually seek any treatment for them but this one has been going on for days or so by now. He reports low-grade temperatures at home and diarrhea. He's got general lower abdominal pain as well as pain specifically in the left groin. His surgical history is a little bit murky, but from what I can gather: Appendectomy 5053, complicated by bleeding and he states they had to go back in Ex Lap 1980 by midline for bowel obstruction LIH 1988- the incision for this he states was on his thigh RIH 2010- he has a low midline vertical incision and he states that the hernia was repaired through this??? Laparoscopic assisted sigmoid colectomy 2012 lap chole 2015.  The patient is a 67 year old male.   Past Surgical History Nathan Moon, Nathan Moon; 02/24/2017 3:23 PM) Appendectomy Colon Polyp Removal - Open Colon Removal - Partial Gallbladder Surgery - Laparoscopic Laparoscopic Inguinal Hernia Surgery Right. Open Inguinal Hernia Surgery Left. Oral Surgery Shoulder Surgery Right. Spinal Surgery - Lower Back Spinal Surgery - Neck  Diagnostic Studies History Nathan Moon, Nathan Moon; 02/24/2017 3:23 PM) Colonoscopy 1-5 years ago  Allergies Nathan Moon, Nathan Moon; 02/24/2017 3:25  PM) No Known Allergies [02/24/2017]:  Medication History Nathan Moon, Nathan Moon; 02/24/2017 3:28 PM) Norvasc (5MG  Tablet, Oral) Active. Atorvastatin Calcium (40MG  Tablet, Oral) Active. Sucralfate (1GM Tablet, Oral) Active. Nortriptyline HCl (25MG  Capsule, Oral) Active. Pantoprazole Sodium (40MG  Tablet DR, Oral) Active. Riboflavin (400MG  Tablet, Oral) Active. Magnesium (500MG  Tablet, Oral) Active. Nasonex (50MCG/ACT Suspension, Nasal) Active. Metoprolol Succinate ER (25MG  Tablet ER 24HR, Oral) Active. Multivitamins/Minerals (Oral) Active. Zolpidem Tartrate (10MG  Tablet, Oral) Active. Medications Reconciled  Social History Nathan Moon, Nathan Moon; 02/24/2017 3:23 PM) Alcohol use Occasional alcohol use. Caffeine use Carbonated beverages, Coffee. Illicit drug use Remotely quit drug use. Tobacco use Never smoker.  Family History Nathan Moon, [place]; 02/24/2017 3:23 PM) Arthritis Father, Mother, Son. Bleeding disorder Mother. Depression Daughter. Diabetes Mellitus Mother. Heart Disease Father. Hypertension Mother, Sister. Migraine Headache Mother.  Other Problems Nathan Moon, Nathan Moon; 02/24/2017 3:23 PM) Arthritis Back Pain Chest pain Cholelithiasis Diverticulosis Gastric Ulcer Gastroesophageal Reflux Disease Hemorrhoids High blood pressure Hypercholesterolemia Inguinal Hernia Migraine Headache Transfusion history     Review of Systems (Nathan Moon Nathan Moon; 02/24/2017 3:23 PM) General Present- Fatigue and Weight Gain. Not Present- Appetite Loss, Chills, Fever, Night Sweats and Weight Loss. Skin Not Present- Change in Wart/Mole, Dryness, Hives, Jaundice, New Lesions, Non-Healing Wounds, Rash and Ulcer. HEENT Not Present- Earache, Hearing Loss, Hoarseness, Nose Bleed, Oral Ulcers, Ringing in the Ears, Seasonal Allergies, Sinus Pain, Sore Throat, Visual Disturbances, Wears glasses/contact lenses and Yellow Eyes. Respiratory Present- Snoring. Not Present- Bloody  sputum, Chronic Cough, Difficulty Breathing and Wheezing. Breast Not Present- Breast Mass, Breast Pain, Nipple Discharge and Skin Changes. Cardiovascular Not Present- Chest Pain,  Difficulty Breathing Lying Down, Leg Cramps, Palpitations, Rapid Heart Rate, Shortness of Breath and Swelling of Extremities. Gastrointestinal Present- Abdominal Pain, Bloating, Change in Bowel Habits, Chronic diarrhea, Difficulty Swallowing, Excessive gas, Hemorrhoids, Indigestion and Nausea. Not Present- Bloody Stool, Constipation, Gets full quickly at meals, Rectal Pain and Vomiting. Male Genitourinary Present- Impotence. Not Present- Blood in Urine, Change in Urinary Stream, Frequency, Nocturia, Painful Urination, Urgency and Urine Leakage.  Vitals (Nathan Moon Nathan Moon; 02/24/2017 3:25 PM) 02/24/2017 3:24 PM Weight: 225 lb Height: 71in Body Surface Area: 2.22 m Body Mass Index: 31.38 kg/m  Temp.: 33F  Pulse: 100 (Regular)  P.OX: 95% (Room air) BP: 142/84 (Sitting, Left Arm, Standard)      Physical Exam (Nathan Moon A. Kae Heller MD; 02/24/2017 3:50 PM)  General Note: alert and well appearing  Integumentary Note: warm and dry  Head and Neck Note: no mass or thyromegaly  Eye Note: No scleral icterus. Extra ocular motions intact.  ENMT Note: Moist mucous membranes, dentition intact  Chest and Lung Exam Note: Unlabored respirations, clear bilaterally  Cardiovascular Note: Regular rate and rhythm, no pedal edema  Abdomen Note: Soft, nontender, nondistended. There is a partially reducible left inguinal hernia. No palpable right inguinal hernia. The pole well-healed scars including a low midline. I cannot see any scars in either groin consistent with open inguinal hernia repair  Neurologic Note: Grossly intact, normal gait  Neuropsychiatric Note: Normal mood and affect. Appropriate insight.  Musculoskeletal Note: Strength symmetrical throughout, no deformity    Assessment & Plan  (Nathan Moon A. Kae Heller MD; 02/24/2017 3:51 PM)  INGUINAL HERNIA (K40.90) Story: On the left, question if this is a recurrence or not although he does report history of repair on that side. This is symptomatic with pain. I recommended open left inguinal hernia repair with mesh. Discussed risks of bleeding, infection, pain, scarring, injury to structures in the area specifically nerves, gonadal vessels or vas deferens or any structures contained within the hernia sac. Discussed risk of chronic groin pain or numbness, discussed risk of hernia recurrence. He presents understanding and desires to proceed with hernia surgery. We'll get him scheduled coming weeks.

## 2017-02-24 NOTE — H&P (Signed)
Birdie Hopes Documented: 02/24/2017 3:22 PM Location: [facility] Patient #: [id number] DOB: 07/20/1950 Divorced / Language: Nathan Moon / Race: White Male  History of Present Illness ([name] MD; 02/24/2017 3:48 PM) Patient words: This is a very pleasant 67 year old man who is well known to our practice status post multiple abdominal surgeries, most recently a laparoscopic cholecystectomy by Dr. Rush Farmer in 2015. He has a left inguinal hernia which has become increasingly symptomatic over the last 4 or 5 days. He feels her there is a knot in his left groin. 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 He states these usually last 3 or 4 days and he doesn't usually seek any treatment for them but this one has been going on for days or so by now. He reports low-grade temperatures at home and diarrhea. He's got general lower abdominal pain as well as pain specifically in the left groin. His surgical history is a little bit murky, but from what I can gather: Appendectomy 6546, complicated by bleeding and he states they had to go back in Ex Lap 1980 by midline for bowel obstruction LIH 1988- the incision for this he states was on his thigh RIH 2010- he has a low midline vertical incision and he states that the hernia was repaired through this??? Laparoscopic assisted sigmoid colectomy 2012 lap chole 2015.  The patient is a 67 year old male.   Past Surgical History Benjiman Moon, [place]; 02/24/2017 3:23 PM) Appendectomy Colon Polyp Removal - Open Colon Removal - Partial Gallbladder Surgery - Laparoscopic Laparoscopic Inguinal Hernia Surgery Right. Open Inguinal Hernia Surgery Left. Oral Surgery Shoulder Surgery Right. Spinal Surgery - Lower Back Spinal Surgery - Neck  Diagnostic Studies History Benjiman Moon, [place]; 02/24/2017 3:23 PM) Colonoscopy 1-5 years ago  Allergies Benjiman Moon, [place]; 02/24/2017 3:25  PM) No Known Allergies [02/24/2017]:  Medication History Benjiman Moon, CMA; 02/24/2017 3:28 PM) Norvasc (5MG  Tablet, Oral) Active. Atorvastatin Calcium (40MG  Tablet, Oral) Active. Sucralfate (1GM Tablet, Oral) Active. Nortriptyline HCl (25MG  Capsule, Oral) Active. Pantoprazole Sodium (40MG  Tablet DR, Oral) Active. Riboflavin (400MG  Tablet, Oral) Active. Magnesium (500MG  Tablet, Oral) Active. Nasonex (50MCG/ACT Suspension, Nasal) Active. Metoprolol Succinate ER (25MG  Tablet ER 24HR, Oral) Active. Multivitamins/Minerals (Oral) Active. Zolpidem Tartrate (10MG  Tablet, Oral) Active. Medications Reconciled  Social History Benjiman Moon, CMA; 02/24/2017 3:23 PM) Alcohol use Occasional alcohol use. Caffeine use Carbonated beverages, Coffee. Illicit drug use Remotely quit drug use. Tobacco use Never smoker.  Family History Benjiman Moon, [place]; 02/24/2017 3:23 PM) Arthritis Father, Mother, Son. Bleeding disorder Mother. Depression Daughter. Diabetes Mellitus Mother. Heart Disease Father. Hypertension Mother, Sister. Migraine Headache Mother.  Other Problems Benjiman Moon, CMA; 02/24/2017 3:23 PM) Arthritis Back Pain Chest pain Cholelithiasis Diverticulosis Gastric Ulcer Gastroesophageal Reflux Disease Hemorrhoids High blood pressure Hypercholesterolemia Inguinal Hernia Migraine Headache Transfusion history     Review of Systems ([name] CMA; 02/24/2017 3:23 PM) General Present- Fatigue and Weight Gain. Not Present- Appetite Loss, Chills, Fever, Night Sweats and Weight Loss. Skin Not Present- Change in Wart/Mole, Dryness, Hives, Jaundice, New Lesions, Non-Healing Wounds, Rash and Ulcer. HEENT Not Present- Earache, Hearing Loss, Hoarseness, Nose Bleed, Oral Ulcers, Ringing in the Ears, Seasonal Allergies, Sinus Pain, Sore Throat, Visual Disturbances, Wears glasses/contact lenses and Yellow Eyes. Respiratory Present- Snoring. Not Present- Bloody  sputum, Chronic Cough, Difficulty Breathing and Wheezing. Breast Not Present- Breast Mass, Breast Pain, Nipple Discharge and Skin Changes. Cardiovascular Not Present- Chest Pain,  Difficulty Breathing Lying Down, Leg Cramps, Palpitations, Rapid Heart Rate, Shortness of Breath and Swelling of Extremities. Gastrointestinal Present- Abdominal Pain, Bloating, Change in Bowel Habits, Chronic diarrhea, Difficulty Swallowing, Excessive gas, Hemorrhoids, Indigestion and Nausea. Not Present- Bloody Stool, Constipation, Gets full quickly at meals, Rectal Pain and Vomiting. Male Genitourinary Present- Impotence. Not Present- Blood in Urine, Change in Urinary Stream, Frequency, Nocturia, Painful Urination, Urgency and Urine Leakage.  Vitals ([name] CMA; 02/24/2017 3:25 PM) 02/24/2017 3:24 PM Weight: 225 lb Height: 71in Body Surface Area: 2.22 m Body Mass Index: 31.38 kg/m  Temp.: 66F  Pulse: 100 (Regular)  P.OX: 95% (Room air) BP: 142/84 (Sitting, Left Arm, Standard)      Physical Exam ([name] MD; 02/24/2017 3:50 PM)  General Note: alert and well appearing  Integumentary Note: warm and dry  Head and Neck Note: no mass or thyromegaly  Eye Note: No scleral icterus. Extra ocular motions intact.  ENMT Note: Moist mucous membranes, dentition intact  Chest and Lung Exam Note: Unlabored respirations, clear bilaterally  Cardiovascular Note: Regular rate and rhythm, no pedal edema  Abdomen Note: Soft, nontender, nondistended. There is a partially reducible left inguinal hernia. No palpable right inguinal hernia. The pole well-healed scars including a low midline. I cannot see any scars in either groin consistent with open inguinal hernia repair  Neurologic Note: Grossly intact, normal gait  Neuropsychiatric Note: Normal mood and affect. Appropriate insight.  Musculoskeletal Note: Strength symmetrical throughout, no deformity    Assessment & Plan  ([name] MD; 02/24/2017 3:51 PM)  INGUINAL HERNIA (K40.90) Story: On the left, question if this is a recurrence or not although he does report history of repair on that side. This is symptomatic with pain. I recommended open left inguinal hernia repair with mesh. Discussed risks of bleeding, infection, pain, scarring, injury to structures in the area specifically nerves, gonadal vessels or vas deferens or any structures contained within the hernia sac. Discussed risk of chronic groin pain or numbness, discussed risk of hernia recurrence. He presents understanding and desires to proceed with hernia surgery. We'll get him scheduled coming weeks.

## 2017-03-09 ENCOUNTER — Other Ambulatory Visit: Payer: Self-pay | Admitting: Neurology

## 2017-03-13 NOTE — Pre-Procedure Instructions (Signed)
Nathan Moon  03/13/2017      [id number] - [place], [place] - [address] [place] [address] Phone: [phone] Fax: [phone]    Your procedure is scheduled on Tuesday January 29.  Report to [facility] Admitting at 5:30 A.M.  Call this number if you have problems the morning of surgery:  [phone]   Remember:  Do not eat food or drink liquids after midnight.  Take these medicines the morning of surgery with A SIP OF WATER:   Amlodipine (norvasc) Metoprolol (Toprol-XL) Pantoprazole (protonix)  7 days prior to surgery STOP taking any Aspirin(unless otherwise instructed by your surgeon), Aleve, Naproxen, Ibuprofen, Motrin, Advil, Goody's, BC's, all herbal medications, fish oil, and all vitamins     Do not wear jewelry, make-up or nail polish.  Do not wear lotions, powders, or perfumes, or deodorant.  Do not shave 48 hours prior to surgery.  Men may shave face and neck.  Do not bring valuables to the hospital.  [facility] is not responsible for any belongings or valuables.  Contacts, dentures or bridgework may not be worn into surgery.  Leave your suitcase in the car.  After surgery it may be brought to your room.  For patients admitted to the hospital, discharge time will be determined by your treatment team.  Patients discharged the day of surgery will not be allowed to drive home.   Special instructions:    [place]- Preparing For Surgery  Before surgery, you can play an important role. Because skin is not sterile, your skin needs to be as free of germs as possible. You can reduce the number of germs on your skin by washing with CHG (chlorahexidine gluconate) Soap before surgery.  CHG is an antiseptic cleaner which kills germs and bonds with the skin to continue killing germs even after washing.  Please do not use if you have an allergy to CHG or antibacterial soaps. If your skin becomes  reddened/irritated stop using the CHG.  Do not shave (including legs and underarms) for at least 48 hours prior to first CHG shower. It is OK to shave your face.  Please follow these instructions carefully.   1. Shower the NIGHT BEFORE SURGERY and the MORNING OF SURGERY with CHG.   2. If you chose to wash your hair, wash your hair first as usual with your normal shampoo.  3. After you shampoo, rinse your hair and body thoroughly to remove the shampoo.  4. Use CHG as you would any other liquid soap. You can apply CHG directly to the skin and wash gently with a scrungie or a clean washcloth.   5. Apply the CHG Soap to your body ONLY FROM THE NECK DOWN.  Do not use on open wounds or open sores. Avoid contact with your eyes, ears, mouth and genitals (private parts). Wash Face and genitals (private parts)  with your normal soap.  6. Wash thoroughly, paying special attention to the area where your surgery will be performed.  7. Thoroughly rinse your body with warm water from the neck down.  8. DO NOT shower/wash with your normal soap after using and rinsing off the CHG Soap.  9. Pat yourself dry with a CLEAN TOWEL.  10. Wear CLEAN PAJAMAS to bed the night before surgery, wear comfortable clothes the morning of surgery  11. Place CLEAN SHEETS on your bed the night of your first shower and  DO NOT SLEEP WITH PETS.    Day of Surgery: Do not apply any deodorants/lotions. Please wear clean clothes to the hospital/surgery center.      Please read over the following fact sheets that you were given. Coughing and Deep Breathing and Surgical Site Infection Prevention

## 2017-03-14 ENCOUNTER — Encounter (HOSPITAL_COMMUNITY)
Admission: RE | Admit: 2017-03-14 | Discharge: 2017-03-14 | Disposition: A | Discharge status: Home / Self Care | Payer: BLUE CROSS/BLUE SHIELD | Source: Ambulatory Visit | Attending: Surgery | Admitting: Surgery

## 2017-03-14 ENCOUNTER — Encounter (HOSPITAL_COMMUNITY): Payer: Self-pay

## 2017-03-14 ENCOUNTER — Ambulatory Visit (HOSPITAL_COMMUNITY)
Admission: RE | Admit: 2017-03-14 | Discharge: 2017-03-14 | Disposition: A | Discharge status: Home / Self Care | Payer: BLUE CROSS/BLUE SHIELD | Source: Ambulatory Visit | Attending: Surgery | Admitting: Surgery

## 2017-03-14 ENCOUNTER — Other Ambulatory Visit: Payer: Self-pay

## 2017-03-14 DIAGNOSIS — K4090 Unilateral inguinal hernia, without obstruction or gangrene, not specified as recurrent: Secondary | ICD-10-CM | POA: Insufficient documentation

## 2017-03-14 DIAGNOSIS — Z01810 Encounter for preprocedural cardiovascular examination: Secondary | ICD-10-CM | POA: Diagnosis not present

## 2017-03-14 DIAGNOSIS — Z01818 Encounter for other preprocedural examination: Secondary | ICD-10-CM | POA: Diagnosis not present

## 2017-03-14 DIAGNOSIS — Z01812 Encounter for preprocedural laboratory examination: Secondary | ICD-10-CM | POA: Diagnosis not present

## 2017-03-14 HISTORY — DX: Gastro-esophageal reflux disease without esophagitis: K21.9

## 2017-03-14 HISTORY — DX: Presence of spectacles and contact lenses: Z97.3

## 2017-03-14 LAB — BASIC METABOLIC PANEL
Anion gap: 12 (ref 5–15)
BUN: 17 mg/dL (ref 6–20)
CALCIUM: 9.4 mg/dL (ref 8.9–10.3)
CHLORIDE: 107 mmol/L (ref 101–111)
CO2: 23 mmol/L (ref 22–32)
CREATININE: 1.01 mg/dL (ref 0.61–1.24)
Glucose, Bld: 93 mg/dL (ref 65–99)
Potassium: 3.7 mmol/L (ref 3.5–5.1)
SODIUM: 142 mmol/L (ref 135–145)

## 2017-03-14 LAB — CBC WITH DIFFERENTIAL/PLATELET
BASOS ABS: 0 10*3/uL (ref 0.0–0.1)
BASOS PCT: 1 %
EOS ABS: 0.4 10*3/uL (ref 0.0–0.7)
Eosinophils Relative: 7 %
HCT: 45.6 % (ref 39.0–52.0)
Hemoglobin: 15.6 g/dL (ref 13.0–17.0)
LYMPHS ABS: 1.2 10*3/uL (ref 0.7–4.0)
Lymphocytes Relative: 25 %
MCH: 30.7 pg (ref 26.0–34.0)
MCHC: 34.2 g/dL (ref 30.0–36.0)
MCV: 89.8 fL (ref 78.0–100.0)
Monocytes Absolute: 0.6 10*3/uL (ref 0.1–1.0)
Monocytes Relative: 11 %
NEUTROS PCT: 56 %
Neutro Abs: 2.8 10*3/uL (ref 1.7–7.7)
Platelets: 276 10*3/uL (ref 150–400)
RBC: 5.08 MIL/uL (ref 4.22–5.81)
RDW: 12.5 % (ref 11.5–15.5)
WBC: 5 10*3/uL (ref 4.0–10.5)

## 2017-03-14 NOTE — Progress Notes (Signed)
PCP: Sadie Haber at [facility] Cardiologist: Denies  EKG: Today, surgeon order CXR: Today, surgeon order ECHO: denies Stress Test: denies Cardiac Cath: denies  Patient denies shortness of breath, fever, cough, and chest pain at PAT appointment.  Patient verbalized understanding of instructions provided today at the PAT appointment.  Patient asked to review instructions at home and day of surgery.   Pt reports he takes Metoprolol at night, instructed to make sure he takes it night before surgery. Verbalized understanding.

## 2017-03-21 ENCOUNTER — Ambulatory Visit (HOSPITAL_COMMUNITY): Payer: BLUE CROSS/BLUE SHIELD | Admitting: Anesthesiology

## 2017-03-21 ENCOUNTER — Encounter (HOSPITAL_COMMUNITY)
Admission: RE | Disposition: A | Discharge status: Home / Self Care | Payer: Self-pay | Source: Ambulatory Visit | Attending: Surgery

## 2017-03-21 ENCOUNTER — Ambulatory Visit (HOSPITAL_COMMUNITY)
Admission: RE | Admit: 2017-03-21 | Discharge: 2017-03-21 | Disposition: A | Discharge status: Home / Self Care | Payer: BLUE CROSS/BLUE SHIELD | Source: Ambulatory Visit | Attending: Surgery | Admitting: Surgery

## 2017-03-21 ENCOUNTER — Ambulatory Visit (HOSPITAL_COMMUNITY): Payer: BLUE CROSS/BLUE SHIELD | Admitting: Emergency Medicine

## 2017-03-21 ENCOUNTER — Encounter (HOSPITAL_COMMUNITY): Payer: Self-pay | Admitting: Urology

## 2017-03-21 DIAGNOSIS — K219 Gastro-esophageal reflux disease without esophagitis: Secondary | ICD-10-CM | POA: Diagnosis not present

## 2017-03-21 DIAGNOSIS — K4091 Unilateral inguinal hernia, without obstruction or gangrene, recurrent: Secondary | ICD-10-CM | POA: Insufficient documentation

## 2017-03-21 DIAGNOSIS — G8918 Other acute postprocedural pain: Secondary | ICD-10-CM | POA: Diagnosis not present

## 2017-03-21 DIAGNOSIS — D176 Benign lipomatous neoplasm of spermatic cord: Secondary | ICD-10-CM | POA: Insufficient documentation

## 2017-03-21 DIAGNOSIS — I10 Essential (primary) hypertension: Secondary | ICD-10-CM | POA: Diagnosis not present

## 2017-03-21 DIAGNOSIS — E7800 Pure hypercholesterolemia, unspecified: Secondary | ICD-10-CM | POA: Diagnosis not present

## 2017-03-21 DIAGNOSIS — Z86010 Personal history of colonic polyps: Secondary | ICD-10-CM | POA: Diagnosis not present

## 2017-03-21 DIAGNOSIS — Z79899 Other long term (current) drug therapy: Secondary | ICD-10-CM | POA: Insufficient documentation

## 2017-03-21 HISTORY — PX: INSERTION OF MESH: SHX5868

## 2017-03-21 HISTORY — PX: INGUINAL HERNIA REPAIR: SHX194

## 2017-03-21 SURGERY — REPAIR, HERNIA, INGUINAL, ADULT
Anesthesia: Regional | Site: Inguinal | Laterality: Left

## 2017-03-21 MED ORDER — OXYCODONE HCL 5 MG PO TABS
5.0000 mg | ORAL_TABLET | ORAL | Status: DC | PRN
  Administered 2017-03-21: 10 mg via ORAL

## 2017-03-21 MED ORDER — ROCURONIUM BROMIDE 10 MG/ML (PF) SYRINGE
PREFILLED_SYRINGE | INTRAVENOUS | Status: AC
  Filled 2017-03-21: qty 5

## 2017-03-21 MED ORDER — MIDAZOLAM HCL 2 MG/2ML IJ SOLN
INTRAMUSCULAR | Status: AC
  Filled 2017-03-21: qty 2

## 2017-03-21 MED ORDER — FENTANYL CITRATE (PF) 250 MCG/5ML IJ SOLN
INTRAMUSCULAR | Status: AC
  Filled 2017-03-21: qty 5

## 2017-03-21 MED ORDER — ACETAMINOPHEN 500 MG PO TABS
1000.0000 mg | ORAL_TABLET | ORAL | Status: AC
  Administered 2017-03-21: 1000 mg via ORAL

## 2017-03-21 MED ORDER — OXYCODONE HCL 5 MG PO TABS
ORAL_TABLET | ORAL | Status: AC
  Filled 2017-03-21: qty 2

## 2017-03-21 MED ORDER — ACETAMINOPHEN 650 MG RE SUPP: 650.0000 mg | RECTAL | Status: DC | PRN

## 2017-03-21 MED ORDER — LIDOCAINE 2% (20 MG/ML) 5 ML SYRINGE
INTRAMUSCULAR | Status: DC | PRN
  Administered 2017-03-21: 50 mg via INTRAVENOUS

## 2017-03-21 MED ORDER — SODIUM CHLORIDE 0.9% FLUSH: 3.0000 mL | Freq: Two times a day (BID) | INTRAVENOUS | Status: DC

## 2017-03-21 MED ORDER — 0.9 % SODIUM CHLORIDE (POUR BTL) OPTIME
TOPICAL | Status: DC | PRN
  Administered 2017-03-21: 1000 mL

## 2017-03-21 MED ORDER — LACTATED RINGERS IV SOLN
INTRAVENOUS | Status: DC | PRN
  Administered 2017-03-21 (×2): via INTRAVENOUS

## 2017-03-21 MED ORDER — SUGAMMADEX SODIUM 200 MG/2ML IV SOLN
INTRAVENOUS | Status: DC | PRN
  Administered 2017-03-21: 250 mg via INTRAVENOUS

## 2017-03-21 MED ORDER — FENTANYL CITRATE (PF) 250 MCG/5ML IJ SOLN
INTRAMUSCULAR | Status: DC | PRN
  Administered 2017-03-21 (×3): 50 ug via INTRAVENOUS
  Administered 2017-03-21: 100 ug via INTRAVENOUS
  Administered 2017-03-21 (×3): 50 ug via INTRAVENOUS

## 2017-03-21 MED ORDER — CHLORHEXIDINE GLUCONATE 4 % EX LIQD: 60.0000 mL | Freq: Once | CUTANEOUS | Status: DC

## 2017-03-21 MED ORDER — DEXAMETHASONE SODIUM PHOSPHATE 10 MG/ML IJ SOLN
INTRAMUSCULAR | Status: AC
  Filled 2017-03-21: qty 1

## 2017-03-21 MED ORDER — CELECOXIB 200 MG PO CAPS
ORAL_CAPSULE | ORAL | Status: AC
  Administered 2017-03-21: 200 mg via ORAL
  Filled 2017-03-21: qty 1

## 2017-03-21 MED ORDER — GABAPENTIN 300 MG PO CAPS
300.0000 mg | ORAL_CAPSULE | ORAL | Status: AC
  Administered 2017-03-21: 300 mg via ORAL

## 2017-03-21 MED ORDER — CEFAZOLIN SODIUM-DEXTROSE 2-4 GM/100ML-% IV SOLN
2.0000 g | INTRAVENOUS | Status: AC
  Administered 2017-03-21: 2 g via INTRAVENOUS

## 2017-03-21 MED ORDER — OXYCODONE-ACETAMINOPHEN 5-325 MG PO TABS: 1.0000 | ORAL_TABLET | Freq: Four times a day (QID) | ORAL | 0 refills | Status: DC | PRN

## 2017-03-21 MED ORDER — ONDANSETRON HCL 4 MG/2ML IJ SOLN
INTRAMUSCULAR | Status: DC | PRN
  Administered 2017-03-21: 4 mg via INTRAVENOUS

## 2017-03-21 MED ORDER — ROCURONIUM BROMIDE 10 MG/ML (PF) SYRINGE
PREFILLED_SYRINGE | INTRAVENOUS | Status: DC | PRN
  Administered 2017-03-21: 20 mg via INTRAVENOUS
  Administered 2017-03-21: 30 mg via INTRAVENOUS

## 2017-03-21 MED ORDER — SUGAMMADEX SODIUM 500 MG/5ML IV SOLN
INTRAVENOUS | Status: AC
  Filled 2017-03-21: qty 5

## 2017-03-21 MED ORDER — ROPIVACAINE HCL 7.5 MG/ML IJ SOLN
INTRAMUSCULAR | Status: DC | PRN
  Administered 2017-03-21: 20 mL via PERINEURAL

## 2017-03-21 MED ORDER — ACETAMINOPHEN 500 MG PO TABS
ORAL_TABLET | ORAL | Status: AC
  Administered 2017-03-21: 1000 mg via ORAL
  Filled 2017-03-21: qty 2

## 2017-03-21 MED ORDER — CELECOXIB 200 MG PO CAPS
200.0000 mg | ORAL_CAPSULE | ORAL | Status: AC
  Administered 2017-03-21: 200 mg via ORAL

## 2017-03-21 MED ORDER — PHENYLEPHRINE HCL 10 MG/ML IJ SOLN
INTRAVENOUS | Status: DC | PRN
  Administered 2017-03-21: 30 ug/min via INTRAVENOUS

## 2017-03-21 MED ORDER — PROPOFOL 10 MG/ML IV BOLUS
INTRAVENOUS | Status: AC
  Filled 2017-03-21: qty 40

## 2017-03-21 MED ORDER — BUPIVACAINE-EPINEPHRINE (PF) 0.5% -1:200000 IJ SOLN
INTRAMUSCULAR | Status: AC
  Filled 2017-03-21: qty 30

## 2017-03-21 MED ORDER — BUPIVACAINE-EPINEPHRINE (PF) 0.5% -1:200000 IJ SOLN
INTRAMUSCULAR | Status: DC | PRN
  Administered 2017-03-21: 20 mL

## 2017-03-21 MED ORDER — GABAPENTIN 300 MG PO CAPS
ORAL_CAPSULE | ORAL | Status: AC
  Administered 2017-03-21: 300 mg via ORAL
  Filled 2017-03-21: qty 1

## 2017-03-21 MED ORDER — SODIUM CHLORIDE 0.9% FLUSH: 3.0000 mL | INTRAVENOUS | Status: DC | PRN

## 2017-03-21 MED ORDER — DOCUSATE SODIUM 100 MG PO CAPS: 100.0000 mg | ORAL_CAPSULE | Freq: Two times a day (BID) | ORAL | 0 refills | Status: AC

## 2017-03-21 MED ORDER — SODIUM CHLORIDE 0.9 % IV SOLN: 250.0000 mL | INTRAVENOUS | Status: DC | PRN

## 2017-03-21 MED ORDER — ACETAMINOPHEN 325 MG PO TABS: 650.0000 mg | ORAL_TABLET | ORAL | Status: DC | PRN

## 2017-03-21 MED ORDER — MIDAZOLAM HCL 2 MG/2ML IJ SOLN
INTRAMUSCULAR | Status: DC | PRN
  Administered 2017-03-21: 2 mg via INTRAVENOUS

## 2017-03-21 MED ORDER — ONDANSETRON HCL 4 MG/2ML IJ SOLN
INTRAMUSCULAR | Status: AC
  Filled 2017-03-21: qty 2

## 2017-03-21 MED ORDER — DEXAMETHASONE SODIUM PHOSPHATE 10 MG/ML IJ SOLN
INTRAMUSCULAR | Status: DC | PRN
  Administered 2017-03-21: 10 mg via INTRAVENOUS

## 2017-03-21 MED ORDER — PROPOFOL 10 MG/ML IV BOLUS
INTRAVENOUS | Status: DC | PRN
  Administered 2017-03-21: 150 mg via INTRAVENOUS
  Administered 2017-03-21: 50 mg via INTRAVENOUS

## 2017-03-21 MED ORDER — LIDOCAINE 2% (20 MG/ML) 5 ML SYRINGE
INTRAMUSCULAR | Status: AC
  Filled 2017-03-21: qty 5

## 2017-03-21 MED ORDER — CEFAZOLIN SODIUM-DEXTROSE 2-4 GM/100ML-% IV SOLN
INTRAVENOUS | Status: AC
  Filled 2017-03-21: qty 100

## 2017-03-21 MED ORDER — FENTANYL CITRATE (PF) 100 MCG/2ML IJ SOLN: 25.0000 ug | INTRAMUSCULAR | Status: DC | PRN

## 2017-03-21 SURGICAL SUPPLY — 44 items
APL SKNCLS STERI-STRIP NONHPOA (GAUZE/BANDAGES/DRESSINGS) ×1
BENZOIN TINCTURE PRP APPL 2/3 (GAUZE/BANDAGES/DRESSINGS) ×2 IMPLANT
BLADE CLIPPER SURG (BLADE) ×1 IMPLANT
BLADE SURG 15 STRL LF DISP TIS (BLADE) ×1 IMPLANT
BLADE SURG 15 STRL SS (BLADE) ×2
CHLORAPREP W/TINT 26ML (MISCELLANEOUS) ×2 IMPLANT
COVER SURGICAL LIGHT HANDLE (MISCELLANEOUS) ×2 IMPLANT
DRAIN PENROSE 1/2X12 LTX STRL (WOUND CARE) ×1 IMPLANT
DRAPE LAPAROTOMY TRNSV 102X78 (DRAPE) ×1 IMPLANT
DRAPE UTILITY XL STRL (DRAPES) ×2 IMPLANT
DRSG TEGADERM 4X4.75 (GAUZE/BANDAGES/DRESSINGS) ×2 IMPLANT
ELECT CAUTERY BLADE 6.4 (BLADE) ×2 IMPLANT
ELECT REM PT RETURN 9FT ADLT (ELECTROSURGICAL) ×2
ELECTRODE REM PT RTRN 9FT ADLT (ELECTROSURGICAL) ×1 IMPLANT
GAUZE SPONGE 4X4 12PLY STRL (GAUZE/BANDAGES/DRESSINGS) ×2 IMPLANT
GAUZE SPONGE 4X4 16PLY XRAY LF (GAUZE/BANDAGES/DRESSINGS) ×2 IMPLANT
GLOVE BIO SURGEON STRL SZ 6 (GLOVE) ×2 IMPLANT
GLOVE BIOGEL PI IND STRL 6.5 (GLOVE) ×1 IMPLANT
GLOVE BIOGEL PI INDICATOR 6.5 (GLOVE) ×3
GLOVE SURG SS PI 6.5 STRL IVOR (GLOVE) ×5 IMPLANT
GOWN STRL REUS W/ TWL LRG LVL3 (GOWN DISPOSABLE) ×2 IMPLANT
GOWN STRL REUS W/TWL LRG LVL3 (GOWN DISPOSABLE) ×6
KIT BASIN OR (CUSTOM PROCEDURE TRAY) ×2 IMPLANT
KIT TURNOVER KIT B (KITS) ×1 IMPLANT
MESH ULTRAPRO 3X6 7.6X15CM (Mesh General) ×1 IMPLANT
NDL HYPO 25GX1X1/2 BEV (NEEDLE) ×1 IMPLANT
NEEDLE HYPO 25GX1X1/2 BEV (NEEDLE) ×2 IMPLANT
NS IRRIG 1000ML POUR BTL (IV SOLUTION) ×2 IMPLANT
PACK SURGICAL SETUP 50X90 (CUSTOM PROCEDURE TRAY) ×2 IMPLANT
PAD ARMBOARD 7.5X6 YLW CONV (MISCELLANEOUS) ×2 IMPLANT
PENCIL BUTTON HOLSTER BLD 10FT (ELECTRODE) ×2 IMPLANT
STRIP CLOSURE SKIN 1/2X4 (GAUZE/BANDAGES/DRESSINGS) ×2 IMPLANT
SUT ETHIBOND 0 MO6 C/R (SUTURE) ×2 IMPLANT
SUT MNCRL AB 4-0 PS2 18 (SUTURE) ×2 IMPLANT
SUT SILK 0 TIES 10X30 (SUTURE) ×1 IMPLANT
SUT VIC AB 0 CT2 27 (SUTURE) ×2 IMPLANT
SUT VIC AB 2-0 SH 27 (SUTURE) ×2
SUT VIC AB 2-0 SH 27X BRD (SUTURE) ×1 IMPLANT
SUT VIC AB 3-0 SH 27 (SUTURE) ×2
SUT VIC AB 3-0 SH 27XBRD (SUTURE) ×1 IMPLANT
SYR BULB 3OZ (MISCELLANEOUS) ×1 IMPLANT
SYR CONTROL 10ML LL (SYRINGE) ×2 IMPLANT
TOWEL GREEN STERILE (TOWEL DISPOSABLE) ×1 IMPLANT
TRAY FOLEY CATH SILVER 16FR (SET/KITS/TRAYS/PACK) ×1 IMPLANT

## 2017-03-21 NOTE — Transfer of Care (Signed)
Immediate Anesthesia Transfer of Care Note  Patient: Nathan Moon  Procedure(s) Performed: OPEN LEFT INGUINAL HERNIA REPAIR WITH MESH (Left Inguinal) INSERTION OF MESH (Left Inguinal)  Patient Location: PACU  Anesthesia Type:GA combined with regional for post-op pain  Level of Consciousness: awake, alert  and oriented  Airway & Oxygen Therapy: Patient Spontanous Breathing and Patient connected to nasal cannula oxygen  Post-op Assessment: Report given to RN and Post -op Vital signs reviewed and stable  Post vital signs: Reviewed and stable  Last Vitals:  Vitals:   03/21/17 0723 03/21/17 0724  BP:    Pulse: 86   Resp: 13 17  Temp:    SpO2: 98%     Last Pain:  Vitals:   03/21/17 0611  TempSrc:   PainSc: 5          Complications: No apparent anesthesia complications

## 2017-03-21 NOTE — Discharge Instructions (Signed)
HERNIA REPAIR: POST OP INSTRUCTIONS ° °###################################################################### ° °EAT °Gradually transition to a high fiber diet with a fiber supplement over the next few weeks after discharge.  Start with a pureed / full liquid diet (see below) ° °WALK °Walk an hour a day.  Control your pain to do that.   ° °CONTROL PAIN °Control pain so that you can walk, sleep, tolerate sneezing/coughing, go up/down stairs. ° °HAVE A BOWEL MOVEMENT DAILY °Keep your bowels regular to avoid problems.  OK to try a laxative to override constipation.  OK to use an antidairrheal to slow down diarrhea.  Call if not better after 2 tries ° °CALL IF YOU HAVE PROBLEMS/CONCERNS °Call if you are still struggling despite following these instructions. °Call if you have concerns not answered by these instructions ° °###################################################################### ° ° ° °1. DIET: Follow a light bland diet the first 24 hours after arrival home, such as soup, liquids, crackers, etc.  Be sure to include lots of fluids daily.  Avoid fast food or heavy meals as your are more likely to get nauseated.  Eat a low fat the next few days after surgery. °2. Take your usually prescribed home medications unless otherwise directed. °3. PAIN CONTROL: °a. Pain is best controlled by a usual combination of three different methods TOGETHER: °i. Ice/Heat °ii. Over the counter pain medication °iii. Prescription pain medication °b. Most patients will experience some swelling and bruising around the hernia(s) such as the bellybutton, groins, or old incisions.  Ice packs or heating pads (30-60 minutes up to 6 times a day) will help. Use ice for the first few days to help decrease swelling and bruising, then switch to heat to help relax tight/sore spots and speed recovery.  Some people prefer to use ice alone, heat alone, alternating between ice & heat.  Experiment to what works for you.  Swelling and bruising can take  several weeks to resolve.   °c. It is helpful to take an over-the-counter pain medication regularly for the first few weeks.  Choose one of the following that works best for you: °i. Naproxen (Aleve, etc)  Two 220mg tabs twice a day °ii. Ibuprofen (Advil, etc) Three 200mg tabs four times a day (every meal & bedtime) °iii. Acetaminophen (Tylenol, etc) 325-650mg four times a day (every meal & bedtime) °d. A  prescription for pain medication should be given to you upon discharge.  Take your pain medication as prescribed.  °i. If you are having problems/concerns with the prescription medicine (does not control pain, nausea, vomiting, rash, itching, etc), please call us [phone] to see if we need to switch you to a different pain medicine that will work better for you and/or control your side effect better. °ii. If you need a refill on your pain medication, please contact your pharmacy.  They will contact our office to request authorization. Prescriptions will not be filled after 5 pm or on week-ends. °4. Avoid getting constipated.  Between the surgery and the pain medications, it is common to experience some constipation.  Increasing fluid intake and taking a fiber supplement (such as Metamucil, Citrucel, FiberCon, MiraLax, etc) 1-2 times a day regularly will usually help prevent this problem from occurring.  A mild laxative (prune juice, Milk of Magnesia, MiraLax, etc) should be taken according to package directions if there are no bowel movements after 48 hours.   °5. Wash / shower every day.  You may shower over the dressings as they are waterproof.   °6. Remove   your waterproof bandages 3 days after surgery. Steri strips will peel off after 1-2 weeks. You may leave the incision open to air.  You may replace a dressing/Band-Aid to cover the incision for comfort if you wish.  Continue to shower over incision(s) after the dressing is off.    7. ACTIVITIES as tolerated:   a. You may resume regular (light)  daily activities beginning the next day--such as daily self-care, walking, climbing stairs--gradually increasing activities as tolerated.  If you can walk 30 minutes without difficulty, it is safe to try more intense activity such as jogging, treadmill, bicycling, low-impact aerobics, swimming, etc. b. Refrain from the most intensive and strenuous activity for last such as sit-ups, heavy lifting, contact sports, etc  Refrain from any heavy lifting or straining until 6 weeks after surgery.   c. DO NOT PUSH THROUGH PAIN.  Let pain be your guide: If it hurts to do something, don't do it.  Pain is your body warning you to avoid that activity for another week until the pain goes down. d. You may drive when you are no longer taking prescription pain medication, you can comfortably wear a seatbelt, and you can safely maneuver your car and apply brakes. e. Dennis Bast may have sexual intercourse when it is comfortable.  8. FOLLOW UP in our office a. Please call [facility] at [phone] to set up an appointment to see your surgeon in the office for a follow-up appointment approximately 2-3 weeks after your surgery. b. Make sure that you call for this appointment the day you arrive home to insure a convenient appointment time. 9.  IF YOU HAVE DISABILITY OR FAMILY LEAVE FORMS, BRING THEM TO THE OFFICE FOR PROCESSING.  DO NOT GIVE THEM TO YOUR DOCTOR.  WHEN TO CALL us [phone]: 1. Poor pain control 2. Reactions / problems with new medications (rash/itching, nausea, etc)  3. Fever over 101.5 F (38.5 C) 4. Inability to urinate 5. Nausea and/or vomiting 6. Worsening swelling or bruising 7. Continued bleeding from incision. 8. Increased pain, redness, or drainage from the incision   The clinic staff is available to answer your questions during regular business hours (8:30am-5pm).  Please dont hesitate to call and ask to speak to one of our nurses for clinical concerns.   If you have a medical emergency, go to the  nearest emergency room or call 911.  A surgeon from [facility] is always on call at the hospitals in [facility], [place], [place], [place], [place]  [address] ?  [address] [address], [place], [place]   [address] MAIN: [phone] ? TOLL FREE: [phone] ? FAX: [phone] [email or web address]

## 2017-03-21 NOTE — Anesthesia Procedure Notes (Signed)
Procedure Name: Intubation Date/Time: 03/21/2017 7:39 AM Performed by: Valda Favia, CRNA Pre-anesthesia Checklist: Patient identified, Emergency Drugs available, Suction available and Patient being monitored Patient Re-evaluated:Patient Re-evaluated prior to induction Oxygen Delivery Method: Circle System Utilized Preoxygenation: Pre-oxygenation with 100% oxygen Induction Type: IV induction Ventilation: Mask ventilation without difficulty and Oral airway inserted - appropriate to patient size Laryngoscope Size: Mac and 4 Grade View: Grade II Tube type: Oral Tube size: 7.5 mm Number of attempts: 1 Airway Equipment and Method: Stylet and Oral airway Placement Confirmation: ETT inserted through vocal cords under direct vision,  positive ETCO2 and breath sounds checked- equal and bilateral Secured at: 23 cm Tube secured with: Tape Dental Injury: Teeth and Oropharynx as per pre-operative assessment

## 2017-03-21 NOTE — Op Note (Signed)
Operative Note  Nathan Moon  [id number]  [id number]  03/21/2017   Surgeon: Clovis Riley  Assistant: OR stff  Procedure performed: Open inguinal hernia repair with UltraPro mesh: left, recurrent indirect and direct hernias, excision of cord lipoma  Preop diagnosis: recurrent left inguinal hernia  Post-op diagnosis/intraop findings: recurrent left inguinal hernia with direct and indirect components, cord lipoma  Specimens: none  EBL: 5cc  Complications: none  Description of procedure: After obtaining informed consent an placement of a left-sided taps block by Dr. Roanna Banning in holding, the patient was taken to the operating room and placed supine on operating room table wheregeneral anesthesia was initiated, preoperative antibiotics were administered, SCDs applied, and a formal timeout was performed. A Foley catheter was inserted. The groin was clipped, prepped and draped in the usual sterile fashion. And old incision was noted in the left groin was actually inferior to the inguinal ligament and above the groin crease. An oblique incision was made in the groin 1 fingerbreadth above the pubic tubercle extending towards the anterior superior iliac spine after infiltrating the tissues with local anesthetic. Soft tissues were dissected using electrocautery until the external oblique aponeurosis was encountered.  And this was extremely scarred and attenuated and was actually divided with the Bovie cautery to expose the spermatic cord and contents of the inguinal canal. A plane was bluntly developed between the spermatic cord and the external oblique. This was difficult due to scarring from prior surgery although no suture material or mesh was encountered. The spermatic cord was then bluntly dissected away from the pubic tubercle and encircled with a Penrose. Inspection of the inguinal anatomy revealed a direct hernia with incarcerated fat as well as an indirect hernia and a cord  lipoma. The indirect hernia sac was bluntly dissected away from the cord structures. Once we had affirmatively identified the sac, it was carefully opened in a region that was very thin. Inspection confirmed communication with the peritoneal cavity with no bowel contained currently. The sac was then twisted and clamped at the internal ring, where it was suture ligated with a 3-0 vicryl and reduced into the abdomen. there was a significant amount of cord lipoma along the cord and along the indirect sac and this was excised as able with the pedicled lipoma ligated with a 0 silk tie at the level of the internal ring. The internal ring was narrowed down with the interrupted suture of 0 Vicryl in the floor of the canal medial to the spermatic cord in order to try and keep the remaining preperitoneal fat from protruding into the field. The direct inguinal hernia was reduced, and a figure of eightsuture of 0 Vicryl wasused to reapproximate the floor of the inguinal canal to flatten the surface and keep the hernia out of the field. A 3 x 6 piece of ultra Pro mesh was brought onto the field and trimmed to approximate the field. This was tacked to the pubic tubercle fascia using 0 ethibond. interrupted 0 ethibonds were then used to tack the mesh to the inferior shelving edge. Superiorly the mesh was tacked to the internal oblique using interrupted 0 ethibond. The tails of the mesh were wrapped around the spermatic cord, ensuring adequate room for the cord,and tacked to each other with 0 ethibond, and then directed laterally to lie flat. There was a surprising amount of scar tissue laterally in the plane below the external oblique. Hemostasis was ensured within the wound. The Penrose was removed. What could be found  of the external oblique aponeurosis was reapproximated with a running 2-0 Vicryl to re-create a narrowed external ring. The Scarpa's was reapproximated with interrupted 2-0 Vicryls. The skin was closed with a  running subcuticular Monocryl. The remainder of the 1% lidocaine with epinephrine was injected in the subcutaneous and subcuticular space. The field was then cleaned, benzoin and Steri-Strips and sterile bandage were applied. Both testicles were palpated in the scrotum at the end of the case. The patient was then awakened extubated and taken to PACU in stable condition.   All counts were correct at the completion of the case

## 2017-03-21 NOTE — Anesthesia Procedure Notes (Signed)
Anesthesia Regional Block: TAP block   Pre-Anesthetic Checklist: ,, timeout performed, Correct Patient, Correct Site, Correct Laterality, Correct Procedure,, site marked, risks and benefits discussed, Surgical consent,  Pre-op evaluation,  At surgeon's request and post-op pain management  Laterality: Left  Prep: chloraprep       Needles:  Injection technique: Single-shot  Needle Type: Echogenic Stimulator Needle     Needle Length: 9cm  Needle Gauge: 21     Additional Needles:   Procedures:,,,, ultrasound used (permanent image in chart),,,,  Narrative:  Start time: 03/21/2017 7:15 AM End time: 03/21/2017 7:25 AM Injection made incrementally with aspirations every 5 mL.  Performed by: Personally  Anesthesiologist: Murvin Natal, MD  Additional Notes: Functioning IV was confirmed and monitors were applied.  A 41mm 21ga Arrow echogenic stimulator needle was used. Sterile prep, hand hygiene and sterile gloves were used.  Negative aspiration and negative test dose prior to incremental administration of local anesthetic. The patient tolerated the procedure well.

## 2017-03-21 NOTE — Anesthesia Preprocedure Evaluation (Addendum)
Anesthesia Evaluation  Patient identified by MRN, date of birth, ID band Patient awake    Reviewed: Allergy & Precautions, H&P , NPO status , Patient's Chart, lab work & pertinent test results  Airway Mallampati: II  TM Distance: >3 FB Neck ROM: Full    Dental no notable dental hx.    Pulmonary neg pulmonary ROS,    Pulmonary exam normal breath sounds clear to auscultation       Cardiovascular hypertension, Pt. on medications and Pt. on home beta blockers Normal cardiovascular exam Rhythm:Regular Rate:Normal  ECG: SR, PAC's, rate 68   Neuro/Psych negative neurological ROS  negative psych ROS   GI/Hepatic Neg liver ROS, PUD, GERD  Medicated and Controlled,  Endo/Other  negative endocrine ROS  Renal/GU negative Renal ROS     Musculoskeletal  (+) Arthritis ,   Abdominal (+) + obese,   Peds  Hematology HLD   Anesthesia Other Findings LEFT INGUINAL HERNIA  Reproductive/Obstetrics                            Anesthesia Physical  Anesthesia Plan  ASA: II  Anesthesia Plan: General and Regional   Post-op Pain Management: GA combined w/ Regional for post-op pain   Induction: Intravenous  PONV Risk Score and Plan: 3 and Ondansetron, Dexamethasone, Midazolam and Treatment may vary due to age or medical condition  Airway Management Planned: Oral ETT  Additional Equipment:   Intra-op Plan:   Post-operative Plan: Extubation in OR  Informed Consent: I have reviewed the patients History and Physical, chart, labs and discussed the procedure including the risks, benefits and alternatives for the proposed anesthesia with the patient or authorized representative who has indicated his/her understanding and acceptance.     Plan Discussed with: CRNA  Anesthesia Plan Comments:         Anesthesia Quick Evaluation

## 2017-03-21 NOTE — Interval H&P Note (Signed)
History and Physical Interval Note:  03/21/2017 7:05 AM  Nathan Moon  has presented today for surgery, with the diagnosis of LEFT INGUINAL HERNIA  The various methods of treatment have been discussed with the patient and family. After consideration of risks, benefits and other options for treatment, the patient has consented to  Procedure(s): OPEN LEFT INGUINAL HERNIA REPAIR WITH MESH (Left) INSERTION OF MESH (Left) as a surgical intervention .  The patient's history has been reviewed, patient examined, no change in status, stable for surgery.  I have reviewed the patient's chart and labs.  Questions were answered to the patient's satisfaction.     Anmol Paschen Rich Brave

## 2017-03-21 NOTE — Anesthesia Postprocedure Evaluation (Signed)
Anesthesia Post Note  Patient: JACOLBY RISBY  Procedure(s) Performed: OPEN LEFT INGUINAL HERNIA REPAIR WITH MESH (Left Inguinal) INSERTION OF MESH (Left Inguinal)     Patient location during evaluation: PACU Anesthesia Type: Regional and General Level of consciousness: awake and alert Pain management: pain level controlled Vital Signs Assessment: post-procedure vital signs reviewed and stable Respiratory status: spontaneous breathing, nonlabored ventilation, respiratory function stable and patient connected to nasal cannula oxygen Cardiovascular status: blood pressure returned to baseline and stable Postop Assessment: no apparent nausea or vomiting Anesthetic complications: no    Last Vitals:  Vitals:   03/21/17 1000 03/21/17 1015  BP: 134/81 (!) 145/89  Pulse: 76 76  Resp: 14 18  Temp: 36.6 C   SpO2: 94% 100%    Last Pain:  Vitals:   03/21/17 0611  TempSrc:   PainSc: 5                  Ryan P Ellender

## 2017-03-22 ENCOUNTER — Encounter (HOSPITAL_COMMUNITY): Payer: Self-pay | Admitting: Surgery

## 2017-06-15 ENCOUNTER — Encounter (HOSPITAL_COMMUNITY): Payer: Self-pay | Admitting: *Deleted

## 2017-06-16 ENCOUNTER — Encounter: Payer: Self-pay | Admitting: Neurology

## 2017-06-27 DIAGNOSIS — M109 Gout, unspecified: Secondary | ICD-10-CM | POA: Diagnosis not present

## 2017-06-27 DIAGNOSIS — G4700 Insomnia, unspecified: Secondary | ICD-10-CM | POA: Diagnosis not present

## 2017-06-27 DIAGNOSIS — K219 Gastro-esophageal reflux disease without esophagitis: Secondary | ICD-10-CM | POA: Diagnosis not present

## 2017-06-27 DIAGNOSIS — E782 Mixed hyperlipidemia: Secondary | ICD-10-CM | POA: Diagnosis not present

## 2017-06-27 DIAGNOSIS — I10 Essential (primary) hypertension: Secondary | ICD-10-CM | POA: Diagnosis not present

## 2018-01-04 DIAGNOSIS — E782 Mixed hyperlipidemia: Secondary | ICD-10-CM | POA: Diagnosis not present

## 2018-01-04 DIAGNOSIS — Z23 Encounter for immunization: Secondary | ICD-10-CM | POA: Diagnosis not present

## 2018-01-04 DIAGNOSIS — G4700 Insomnia, unspecified: Secondary | ICD-10-CM | POA: Diagnosis not present

## 2018-01-04 DIAGNOSIS — M25552 Pain in left hip: Secondary | ICD-10-CM | POA: Diagnosis not present

## 2018-01-04 DIAGNOSIS — I10 Essential (primary) hypertension: Secondary | ICD-10-CM | POA: Diagnosis not present

## 2018-02-04 NOTE — Progress Notes (Signed)
NEUROLOGY FOLLOW UP OFFICE NOTE  Nathan Moon [id number]  HISTORY OF PRESENT ILLNESS:  Nathan Moon is a 67 year old right-handed male with hypertension, hyperlipidemia, arthritis and history of gastric ulcer and kidney stone who follows up for complicated migraine.  UPDATE: Last year, nortriptyline was discontinued for a little while but restarted it after a few weeks because he realized it helped his mood.   Last month, he had another complicated migraine (first since last seen a year ago) with headache, dizziness, trouble speaking and understanding others.  It lasted about a week.  Since then, he reports a numbness and tingling on left side of face, intermittent off and on. Current NSAIDS:  no Current analgesics:  no Current triptans:  no Current ergotamine:  no Current anti-emetic:  none Current muscle relaxants:  none Current anti-anxiolytic:  none Current sleep aide:  none Current Antihypertensive medications:  Amlodipine, metoprolol Current Antidepressant medications:  Nortriptyline 25mg  Current Anticonvulsant medications:  none Current anti-CGRP:  none Current Vitamins/Herbal/Supplements:  Magnesium, riboflavin, CoQ10 Current Antihistamines/Decongestants:  none Other therapy:  none  Caffeine:  2 cups of coffee in morning Diet:  Drinks plenty of water Exercise:  No Depression:  No; Anxiety:  No Other pain:  No Sleep hygiene:  Good with Ambien.  HISTORY: In late January 2017, he began experiencing headaches.  They are right temporal and sharp.They are associated with nausea, photophobia and phonophobia.  They would last 2 hours with Excedrin Migraine and would occur frequently (possibly daily).  At the same time, he began having dizzy spells as well.  If he gets up or changes head position too fast, then he would get spinning sensation and have to hold on to something.  It would last a few seconds.  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  He had a headache  with the dizziness.  He was unable to get words out.  He also noted that he couldn't understand or process content of what people were saying to him.  .  There was no particular trigger.  He presented to the ED for further evaluation.  CBC and BMP were unremarkable.  Troponins were negative.  EKG showed sinus rhythm of 60 bpm.  CT of head showed no acute intracranial process.  After evaluation by neurology, it was determined that he likely has a complicated migraine and was treated with a headache cocktail of Compazine, Benadryl and Toradol.  Sed Rate was 6.  MRI of brain performed 04/13/15 was personally reviewed and was normal.  It resolved on its own.  He does report remote history of migraines as a child and has a migraine very rarely.  PAST MEDICAL HISTORY: Past Medical History:  Diagnosis Date  . Arthritis   . Blood transfusion without reported diagnosis   . Diverticulosis   . Gastric ulcer    bleeding ulcer  . GERD (gastroesophageal reflux disease)   . Hernia, incisional    right  . Hyperlipidemia   . Hypertension   . Pneumonia    hx of  . Rash of neck   . Wears glasses     MEDICATIONS: Current Outpatient Medications on File Prior to Visit  Medication Sig Dispense Refill  . amLODipine (NORVASC) 5 MG tablet Take 5 mg by mouth daily.     Marland Kitchen atorvastatin (LIPITOR) 40 MG tablet Take 40 mg by mouth daily.     . celecoxib (CELEBREX) 200 MG capsule Take 200 mg by mouth daily.    Marland Kitchen  Coenzyme Q10 (CO Q-10) 100 MG CAPS Take 400 mg by mouth daily.    . fenofibrate 160 MG tablet Take 160 mg by mouth daily.    . fluticasone (FLONASE) 50 MCG/ACT nasal spray Place 2 sprays into both nostrils daily as needed for allergies or rhinitis.    . magnesium gluconate (MAGONATE) 500 MG tablet Take 500 mg by mouth daily.    . metoprolol succinate (TOPROL-XL) 25 MG 24 hr tablet Take 25 mg by mouth daily.    . Multiple Vitamins-Minerals (MULTIVITAMIN PO) Take 1 tablet by mouth daily.    . nortriptyline  (PAMELOR) 25 MG capsule Take 25 mg by mouth at bedtime.    . nortriptyline (PAMELOR) 25 MG capsule TAKE ONE CAPSULE BY MOUTH AT BEDTIME 90 capsule 2  . oxyCODONE-acetaminophen (PERCOCET/ROXICET) 5-325 MG tablet Take 1 tablet by mouth every 6 (six) hours as needed for severe pain. 20 tablet 0  . pantoprazole (PROTONIX) 40 MG tablet Take 40 mg by mouth daily.    . riboflavin (VITAMIN B-2) 100 MG TABS tablet Take 300 mg by mouth daily.    Marland Kitchen zolpidem (AMBIEN) 10 MG tablet Take 10 mg by mouth at bedtime.      No current facility-administered medications on file prior to visit.     ALLERGIES: No Known Allergies  FAMILY HISTORY: Family History  Problem Relation Age of Onset  . Diabetes Mother   . Arthritis Mother   . Heart disease Mother   . Vision loss Mother   . Arthritis Father   . Heart disease Father     SOCIAL HISTORY: Social History   Socioeconomic History  . Marital status: Divorced    Spouse name: Not on file  . Number of children: Not on file  . Years of education: Not on file  . Highest education level: Not on file  Occupational History  . Not on file  Social Needs  . Financial resource strain: Not on file  . Food insecurity:    Worry: Not on file    Inability: Not on file  . Transportation needs:    Medical: Not on file    Non-medical: Not on file  Tobacco Use  . Smoking status: Never Smoker  . Smokeless tobacco: Never Used  . Tobacco comment: current smoker had been entered in error  Substance and Sexual Activity  . Alcohol use: Yes    Comment: 2-3 glasses of wine a week  . Drug use: No  . Sexual activity: Not on file  Lifestyle  . Physical activity:    Days per week: Not on file    Minutes per session: Not on file  . Stress: Not on file  Relationships  . Social connections:    Talks on phone: Not on file    Gets together: Not on file    Attends religious service: Not on file    Active member of club or organization: Not on file    Attends meetings  of clubs or organizations: Not on file    Relationship status: Not on file  . Intimate partner violence:    Fear of current or ex partner: Not on file    Emotionally abused: Not on file    Physically abused: Not on file    Forced sexual activity: Not on file  Other Topics Concern  . Not on file  Social History Narrative  . Not on file    REVIEW OF SYSTEMS: Constitutional: No fevers, chills, or sweats, no generalized  fatigue, change in appetite Eyes: No visual changes, double vision, eye pain Ear, nose and throat: No hearing loss, ear pain, nasal congestion, sore throat Cardiovascular: No chest pain, palpitations Respiratory:  No shortness of breath at rest or with exertion, wheezes GastrointestinaI: No nausea, vomiting, diarrhea, abdominal pain, fecal incontinence Genitourinary:  No dysuria, urinary retention or frequency Musculoskeletal:  No neck pain, back pain Integumentary: No rash, pruritus, skin lesions Neurological: as above Psychiatric: No depression, insomnia, anxiety Endocrine: No palpitations, fatigue, diaphoresis, mood swings, change in appetite, change in weight, increased thirst Hematologic/Lymphatic:  No purpura, petechiae. Allergic/Immunologic: no itchy/runny eyes, nasal congestion, recent allergic reactions, rashes  PHYSICAL EXAM: Blood pressure 140/90, pulse (!) 117, height 6' (1.829 m), weight 224 lb (101.6 kg), SpO2 97 %. General: No acute distress.  Patient appears well-groomed.   Head:  Normocephalic/atraumatic Eyes:  Fundi examined but not visualized Neck: supple, no paraspinal tenderness, full range of motion Heart:  Regular rate and rhythm Lungs:  Clear to auscultation bilaterally Back: No paraspinal tenderness Neurological Exam: alert and oriented to person, place, and time. Attention span and concentration intact, recent and remote memory intact, fund of knowledge intact.  Speech fluent and not dysarthric, language intact.  Decreased sensation in left  V2-V3 distribution.  Otherwise, CN II-XII intact. Bulk and tone normal, muscle strength 5/5 throughout.  Sensation to light touch intact.  Deep tendon reflexes 2+ throughout, toes downgoing.  Finger to nose testing intact.  Gait normal, Romberg negative.  IMPRESSION: 1.  Migraine with and without aura, without status migrainosus, not intractable.  Recurrent episode, first in a couple of years. 2.  Intermittent left facial numbness.  Unclear etiology.  May be migraine related but other secondary causes must be ruled out.  PLAN: 1.  Increase nortriptyline to 50 mg at bedtime 2.  We will check MRI of the brain without contrast to evaluate for secondary etiology causing left facial numbness 3.  Follow-up in 4 months.  Metta Clines, DO  CC: Corine Shelter, PA-C

## 2018-02-05 ENCOUNTER — Ambulatory Visit (INDEPENDENT_AMBULATORY_CARE_PROVIDER_SITE_OTHER): Payer: BLUE CROSS/BLUE SHIELD | Admitting: Neurology

## 2018-02-05 ENCOUNTER — Encounter: Payer: Self-pay | Admitting: Neurology

## 2018-02-05 VITALS — BP 140/90 | HR 117 | Ht 72.0 in | Wt 224.0 lb

## 2018-02-05 DIAGNOSIS — G43109 Migraine with aura, not intractable, without status migrainosus: Secondary | ICD-10-CM | POA: Diagnosis not present

## 2018-02-05 DIAGNOSIS — R200 Anesthesia of skin: Secondary | ICD-10-CM | POA: Diagnosis not present

## 2018-02-05 DIAGNOSIS — M25552 Pain in left hip: Secondary | ICD-10-CM | POA: Diagnosis not present

## 2018-02-05 DIAGNOSIS — D582 Other hemoglobinopathies: Secondary | ICD-10-CM

## 2018-02-05 MED ORDER — NORTRIPTYLINE HCL 50 MG PO CAPS: 50.0000 mg | ORAL_CAPSULE | Freq: Every day | ORAL | 4 refills | Status: DC

## 2018-02-05 NOTE — Patient Instructions (Addendum)
1.  Increase nortriptyline to 50mg  at bedtime 2.  We will check MRI of brain without contrast 3.  Follow up in 4 months.  We have sent a referral to [facility] for your MRI and they will call you directly to schedule your appt. They are located at [place]. If you need to contact them directly please call [phone].

## 2018-02-22 DIAGNOSIS — M25562 Pain in left knee: Secondary | ICD-10-CM | POA: Diagnosis not present

## 2018-02-25 ENCOUNTER — Ambulatory Visit
Admission: RE | Admit: 2018-02-25 | Discharge: 2018-02-25 | Disposition: A | Discharge status: Home / Self Care | Payer: BLUE CROSS/BLUE SHIELD | Source: Ambulatory Visit | Attending: Neurology | Admitting: Neurology

## 2018-02-25 DIAGNOSIS — R200 Anesthesia of skin: Secondary | ICD-10-CM

## 2018-02-25 DIAGNOSIS — J0100 Acute maxillary sinusitis, unspecified: Secondary | ICD-10-CM | POA: Diagnosis not present

## 2018-02-26 ENCOUNTER — Telehealth: Payer: Self-pay

## 2018-02-26 NOTE — Telephone Encounter (Signed)
-----   Message from [place], DO sent at 02/26/2018  8:58 AM EST ----- Let pt know that MRI brain is normal.  He does have L maxillary and R sphenoid sinusitis.  Needs to f/u with PCP for that soon.  Does he want [place] to make that appt?

## 2018-02-26 NOTE — Telephone Encounter (Signed)
Called and spoke with Pt, advised him of MRI results. Pt will call PCP now to make appt.

## 2018-02-27 ENCOUNTER — Other Ambulatory Visit: Payer: Self-pay | Admitting: Neurology

## 2018-03-16 DIAGNOSIS — J101 Influenza due to other identified influenza virus with other respiratory manifestations: Secondary | ICD-10-CM | POA: Diagnosis not present

## 2018-03-16 DIAGNOSIS — J019 Acute sinusitis, unspecified: Secondary | ICD-10-CM | POA: Diagnosis not present

## 2018-06-14 ENCOUNTER — Ambulatory Visit: Payer: Self-pay | Admitting: Neurology

## 2018-08-01 ENCOUNTER — Ambulatory Visit: Payer: Self-pay | Admitting: Neurology

## 2018-08-11 DIAGNOSIS — Z20828 Contact with and (suspected) exposure to other viral communicable diseases: Secondary | ICD-10-CM | POA: Diagnosis not present

## 2018-08-29 DIAGNOSIS — L03114 Cellulitis of left upper limb: Secondary | ICD-10-CM | POA: Diagnosis not present

## 2018-10-17 ENCOUNTER — Ambulatory Visit: Payer: BLUE CROSS/BLUE SHIELD | Admitting: Neurology

## 2018-10-18 NOTE — Progress Notes (Signed)
NEUROLOGY FOLLOW UP OFFICE NOTE  UNDRAY COLMENERO [id number]  HISTORY OF PRESENT ILLNESS: Nathan Moon is a 68 year old right-handed male with hypertension, hyperlipidemia, arthritis and history of gastric ulcer and kidney stone who follows up for complicated migraine.  UPDATE: Last complicated migraine:  November 2019 (presenting with headache, dizziness, trouble speaking and understanding others.  It lasted about a week.  Following this, he reported numbness and tingling on left side of face, intermittent off and on).  Nortriptyline was increased to 50mg  at bedtime.  Due to continued intermittent symptoms, he had MRI of brain on 02/25/18, which demonstrated normal brain but did show acute left maxillary sinusitis and right sphenoid sinusitis.  Advised to follow up with PCP regarding this (query if the left maxillary sinusitis may be cause of left facial numbness and tingling).  He was treated for sinusitis and numbness resolved.  No recurrent headaches/migraines (0 intensity, 0 duration, 0 frequency)  He got COVID about 3 months ago, it look a month to recover but still has residual fatigue.  On decongestants.  A couple of weeks ago, he started noticing the intermittent left facial tingling. No headache or facial/neck pain.  Current NSAIDS:  no Current analgesics:  no Current triptans:  no Current ergotamine:  no Current anti-emetic:  none Current muscle relaxants:  none Current anti-anxiolytic:  none Current sleep aide:  none Current Antihypertensive medications:  Amlodipine, metoprolol Current Antidepressant medications:  Nortriptyline 50mg  Current Anticonvulsant medications:  none Current anti-CGRP:  none Current Vitamins/Herbal/Supplements:  Magnesium, riboflavin, CoQ10 Current Antihistamines/Decongestants:  none Other therapy:  none  Caffeine:  2 cups of coffee in morning Diet:  Drinks plenty of water Exercise:  No Depression:  No; Anxiety:  No Other pain:  No Sleep  hygiene:  Good with Ambien.  HISTORY: In late January 2017, he began experiencing headaches. They are right temporal and sharp.They are associated with nausea, photophobia and phonophobia. They would last 2 hours with Excedrin Migraine and would occur frequently (possibly daily). At the same time, he began having dizzy spells as well. If he gets up or changes head position too fast, then he would get spinning sensation and have to hold on to something. It would last a few seconds. 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 He was unable to get words out. He also noted that he couldn't understand or process content of what people were saying to him. . There was no particular trigger.He presented to the ED for further evaluation. CBC and BMP were unremarkable. Troponins were negative. EKG showed sinus rhythm of 60 bpm. CT of head showed no acute intracranial process. After evaluation by neurology, it was determined that he likely has a complicated migraine and was treated with a headache cocktail of Compazine, Benadryl and Toradol. Sed Rate was 6. MRI of brain performed 04/13/15 was personally reviewed and was normal.It resolved on its own.  He does report remote history of migraines as a child and has a migraine very rarely.  PAST MEDICAL HISTORY: Past Medical History:  Diagnosis Date  . Arthritis   . Blood transfusion without reported diagnosis   . Diverticulosis   . Gastric ulcer    bleeding ulcer  . GERD (gastroesophageal reflux disease)   . Hernia, incisional    right  . Hyperlipidemia   . Hypertension   . Pneumonia    hx of  . Rash of neck   . Wears glasses  MEDICATIONS: Current Outpatient Medications on File Prior to Visit  Medication Sig Dispense Refill  . amLODipine (NORVASC) 5 MG tablet Take 5 mg by mouth daily.     Marland Kitchen atorvastatin (LIPITOR) 40 MG tablet Take 40 mg by mouth daily.     . celecoxib (CELEBREX)  200 MG capsule Take 200 mg by mouth daily.    . Coenzyme Q10 (CO Q-10) 100 MG CAPS Take 400 mg by mouth daily.    . fenofibrate 160 MG tablet Take 160 mg by mouth daily.    . fluticasone (FLONASE) 50 MCG/ACT nasal spray Place 2 sprays into both nostrils daily as needed for allergies or rhinitis.    . magnesium gluconate (MAGONATE) 500 MG tablet Take 500 mg by mouth daily.    . metoprolol succinate (TOPROL-XL) 25 MG 24 hr tablet Take 25 mg by mouth daily.    . Multiple Vitamins-Minerals (MULTIVITAMIN PO) Take 1 tablet by mouth daily.    . nortriptyline (PAMELOR) 50 MG capsule TAKE 1 CAPSULE (50 MG TOTAL) BY MOUTH AT BEDTIME. 90 capsule 2  . oxyCODONE-acetaminophen (PERCOCET/ROXICET) 5-325 MG tablet Take 1 tablet by mouth every 6 (six) hours as needed for severe pain. (Patient not taking: Reported on 02/05/2018) 20 tablet 0  . pantoprazole (PROTONIX) 40 MG tablet Take 40 mg by mouth daily.    . riboflavin (VITAMIN B-2) 100 MG TABS tablet Take 300 mg by mouth daily.    Marland Kitchen zolpidem (AMBIEN) 10 MG tablet Take 10 mg by mouth at bedtime.      No current facility-administered medications on file prior to visit.     ALLERGIES: No Known Allergies  FAMILY HISTORY: Family History  Problem Relation Age of Onset  . Diabetes Mother   . Arthritis Mother   . Heart disease Mother   . Vision loss Mother   . Arthritis Father   . Heart disease Father     SOCIAL HISTORY: Social History   Socioeconomic History  . Marital status: Divorced    Spouse name: Not on file  . Number of children: Not on file  . Years of education: Not on file  . Highest education level: Not on file  Occupational History  . Not on file  Social Needs  . Financial resource strain: Not on file  . Food insecurity    Worry: Not on file    Inability: Not on file  . Transportation needs    Medical: Not on file    Non-medical: Not on file  Tobacco Use  . Smoking status: Never Smoker  . Smokeless tobacco: Never Used  .  Tobacco comment: current smoker had been entered in error  Substance and Sexual Activity  . Alcohol use: Yes    Comment: 2-3 glasses of wine a week  . Drug use: No  . Sexual activity: Not on file  Lifestyle  . Physical activity    Days per week: Not on file    Minutes per session: Not on file  . Stress: Not on file  Relationships  . Social Herbalist on phone: Not on file    Gets together: Not on file    Attends religious service: Not on file    Active member of club or organization: Not on file    Attends meetings of clubs or organizations: Not on file    Relationship status: Not on file  . Intimate partner violence    Fear of current or ex partner: Not on file  Emotionally abused: Not on file    Physically abused: Not on file    Forced sexual activity: Not on file  Other Topics Concern  . Not on file  Social History Narrative  . Not on file    REVIEW OF SYSTEMS: Constitutional: No fevers, chills, or sweats, no generalized fatigue, change in appetite Eyes: No visual changes, double vision, eye pain Ear, nose and throat: No hearing loss, ear pain, nasal congestion, sore throat Cardiovascular: No chest pain, palpitations Respiratory:  No shortness of breath at rest or with exertion, wheezes GastrointestinaI: No nausea, vomiting, diarrhea, abdominal pain, fecal incontinence Genitourinary:  No dysuria, urinary retention or frequency Musculoskeletal:  No neck pain, back pain Integumentary: No rash, pruritus, skin lesions Neurological: as above Psychiatric: No depression, insomnia, anxiety Endocrine: No palpitations, fatigue, diaphoresis, mood swings, change in appetite, change in weight, increased thirst Hematologic/Lymphatic:  No purpura, petechiae. Allergic/Immunologic: no itchy/runny eyes, nasal congestion, recent allergic reactions, rashes  PHYSICAL EXAM: Blood pressure (!) 153/112, pulse 77, temperature 98 F (36.7 C), height 6' (1.829 m), weight 230 lb  (104.3 kg), SpO2 97 %. General: No acute distress.  Patient appears well-groomed.   Head:  Normocephalic/atraumatic Eyes:  Fundi examined but not visualized Neck: supple, no paraspinal tenderness, full range of motion Heart:  Regular rate and rhythm Lungs:  Clear to auscultation bilaterally Back: No paraspinal tenderness Neurological Exam: alert and oriented to person, place, and time. Attention span and concentration intact, recent and remote memory intact, fund of knowledge intact.  Speech fluent and not dysarthric, language intact.  CN II-XII intact. Bulk and tone normal, muscle strength 5/5 throughout.  Sensation to light touch  intact.  Deep tendon reflexes 2+ throughout.  Finger to nose testing intact.  Gait normal, Romberg negative.  IMPRESSION: 1.  Migraine with and without aura, without status migrainosus, not intractable.  No migraines or headaches since November. 2.  Elevated blood pressure  PLAN: 1.  For preventative management, nortriptyline 50mg  at bedtime. 2. If blood pressure remains elevated, advised to contact his PCP 3. If he continues to have intermittent facial tingling, advised to discuss with PCP about rechecking sinuses.  It does not appear to be related to brain or nerve. 4. Follow up one year   Metta Clines, DO

## 2018-10-19 ENCOUNTER — Encounter: Payer: Self-pay | Admitting: Neurology

## 2018-10-19 ENCOUNTER — Other Ambulatory Visit: Payer: Self-pay

## 2018-10-19 ENCOUNTER — Ambulatory Visit (INDEPENDENT_AMBULATORY_CARE_PROVIDER_SITE_OTHER): Payer: BC Managed Care – PPO | Admitting: Neurology

## 2018-10-19 VITALS — BP 153/112 | HR 77 | Temp 98.0°F | Ht 72.0 in | Wt 230.0 lb

## 2018-10-19 DIAGNOSIS — G43109 Migraine with aura, not intractable, without status migrainosus: Secondary | ICD-10-CM

## 2018-10-19 DIAGNOSIS — I10 Essential (primary) hypertension: Secondary | ICD-10-CM

## 2018-10-19 NOTE — Patient Instructions (Signed)
Continue nortriptyline 50mg  at bedtime If blood pressure remains elevated, contact your PCP Follow up in one year

## 2018-10-25 DIAGNOSIS — Z23 Encounter for immunization: Secondary | ICD-10-CM | POA: Diagnosis not present

## 2018-10-25 DIAGNOSIS — M25552 Pain in left hip: Secondary | ICD-10-CM | POA: Diagnosis not present

## 2018-10-25 DIAGNOSIS — I10 Essential (primary) hypertension: Secondary | ICD-10-CM | POA: Diagnosis not present

## 2018-10-25 DIAGNOSIS — R0602 Shortness of breath: Secondary | ICD-10-CM | POA: Diagnosis not present

## 2018-10-25 DIAGNOSIS — G4700 Insomnia, unspecified: Secondary | ICD-10-CM | POA: Diagnosis not present

## 2018-10-30 ENCOUNTER — Other Ambulatory Visit: Payer: Self-pay | Admitting: Neurology

## 2018-10-31 ENCOUNTER — Other Ambulatory Visit (HOSPITAL_COMMUNITY): Payer: Self-pay | Admitting: Physician Assistant

## 2018-10-31 DIAGNOSIS — I10 Essential (primary) hypertension: Secondary | ICD-10-CM

## 2018-11-06 ENCOUNTER — Other Ambulatory Visit: Payer: Self-pay

## 2018-11-06 ENCOUNTER — Ambulatory Visit (HOSPITAL_COMMUNITY): Payer: BC Managed Care – PPO | Attending: Cardiology

## 2018-11-06 DIAGNOSIS — I10 Essential (primary) hypertension: Secondary | ICD-10-CM

## 2019-03-25 ENCOUNTER — Ambulatory Visit: Payer: Self-pay | Attending: Internal Medicine

## 2019-03-25 DIAGNOSIS — Z20822 Contact with and (suspected) exposure to covid-19: Secondary | ICD-10-CM | POA: Insufficient documentation

## 2019-03-26 LAB — NOVEL CORONAVIRUS, NAA: SARS-CoV-2, NAA: NOT DETECTED

## 2019-03-27 ENCOUNTER — Telehealth: Payer: Self-pay | Admitting: General Practice

## 2019-03-27 NOTE — Telephone Encounter (Signed)
Negative COVID results given. Patient results "NOT Detected." Caller expressed understanding. ° °

## 2019-04-26 DIAGNOSIS — Z131 Encounter for screening for diabetes mellitus: Secondary | ICD-10-CM | POA: Diagnosis not present

## 2019-04-26 DIAGNOSIS — B948 Sequelae of other specified infectious and parasitic diseases: Secondary | ICD-10-CM | POA: Diagnosis not present

## 2019-04-26 DIAGNOSIS — Z136 Encounter for screening for cardiovascular disorders: Secondary | ICD-10-CM | POA: Diagnosis not present

## 2019-04-26 DIAGNOSIS — Z0000 Encounter for general adult medical examination without abnormal findings: Secondary | ICD-10-CM | POA: Diagnosis not present

## 2019-04-26 DIAGNOSIS — I10 Essential (primary) hypertension: Secondary | ICD-10-CM | POA: Diagnosis not present

## 2019-04-26 DIAGNOSIS — J302 Other seasonal allergic rhinitis: Secondary | ICD-10-CM | POA: Diagnosis not present

## 2019-04-26 DIAGNOSIS — E782 Mixed hyperlipidemia: Secondary | ICD-10-CM | POA: Diagnosis not present

## 2019-05-22 DIAGNOSIS — R0683 Snoring: Secondary | ICD-10-CM | POA: Diagnosis not present

## 2019-05-22 DIAGNOSIS — G4719 Other hypersomnia: Secondary | ICD-10-CM | POA: Diagnosis not present

## 2019-05-22 DIAGNOSIS — R0600 Dyspnea, unspecified: Secondary | ICD-10-CM | POA: Diagnosis not present

## 2019-05-30 DIAGNOSIS — R0600 Dyspnea, unspecified: Secondary | ICD-10-CM | POA: Diagnosis not present

## 2019-10-18 NOTE — Progress Notes (Signed)
NEUROLOGY FOLLOW UP OFFICE NOTE  Nathan Moon [id number]  HISTORY OF PRESENT ILLNESS: Nathan Moon is a 69 year old right-handed male with hypertension, hyperlipidemia, arthritis and history of gastric ulcer and kidney stone who follows up for migraine.  UPDATE: He did have COVID and symptoms lingered for a few weeks.  He is doing better now.  No migraines.  Sometimes has postdrome symptoms which he attributes to the post-COVID symptoms.    Current NSAIDS:no Current analgesics:no Current triptans:no Current ergotamine:no Current anti-emetic:none Current muscle relaxants:none Current anti-anxiolytic:none Current sleep aide:none Current Antihypertensive medications:Amlodipine, metoprolol Current Antidepressant medications:Nortriptyline 50mg  Current Anticonvulsant medications:none Current anti-CGRP:none Current Vitamins/Herbal/Supplements:MVI Current Antihistamines/Decongestants:none Other therapy:none  Caffeine:2 cups of coffee in morning Diet:Drinks plenty of water Exercise:No Depression:No; Anxiety:No Other pain:No Sleep hygiene:Good with Ambien.  HISTORY: In late January 2017, he began experiencing headaches. They are right temporal and sharp.They are associated with nausea,photophobia and phonophobia. They would last 2 hours with Excedrin Migraine and would occur frequently (possibly daily). At the same time, he began having dizzy spells as well. If he gets up or changes head position too fast, then he would get spinning sensation and have to hold on to something. It would last a few seconds. 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 He was unable to get words out. He also noted that he couldn't understand or process content of what people were saying to him. .There was no particular trigger.He presented to the ED for further evaluation. CBC and BMP were  unremarkable. Troponins were negative. EKG showed sinus rhythm of 60 bpm. CT of head showed no acute intracranial process. After evaluation by neurology, it was determined that he likely has a complicated migraine and was treated with a headache cocktail of Compazine, Benadryl and Toradol. Sed Rate was 6. MRI of brain performed 04/13/15 was personally reviewed and was normal.It resolved on its own.  Last complicated migraine:  November 2019 (presenting with headache, dizziness, trouble speaking and understanding others. It lasted about a week. Following this, he reported numbness and tingling on left side of face, intermittent off and on).  Nortriptyline was increased to 50mg  at bedtime.  Due to continued intermittent symptoms, he had MRI of brain on 02/25/18, which demonstrated normal brain but did show acute left maxillary sinusitis and right sphenoid sinusitis.  Advised to follow up with PCP regarding this (query if the left maxillary sinusitis may be cause of left facial numbness and tingling).  He was treated for sinusitis and numbness resolved.  No recurrent headaches/migraines (0 intensity, 0 duration, 0 frequency)  He does report remote history of migraines as a child and has a migraine very rarely.  PAST MEDICAL HISTORY: Past Medical History:  Diagnosis Date  . Arthritis   . Blood transfusion without reported diagnosis   . Diverticulosis   . Gastric ulcer    bleeding ulcer  . GERD (gastroesophageal reflux disease)   . Hernia, incisional    right  . Hyperlipidemia   . Hypertension   . Pneumonia    hx of  . Rash of neck   . Wears glasses     MEDICATIONS: Current Outpatient Medications on File Prior to Visit  Medication Sig Dispense Refill  . amLODipine (NORVASC) 5 MG tablet Take 5 mg by mouth daily.     Marland Kitchen atorvastatin (LIPITOR) 40 MG tablet Take 40 mg by mouth daily.     . celecoxib (CELEBREX) 200 MG capsule  Take 200 mg by mouth daily.    . Coenzyme Q10 (CO Q-10) 100 MG  CAPS Take 400 mg by mouth daily.    . fenofibrate 160 MG tablet Take 160 mg by mouth daily.    . fluticasone (FLONASE) 50 MCG/ACT nasal spray Place 2 sprays into both nostrils daily as needed for allergies or rhinitis.    . magnesium gluconate (MAGONATE) 500 MG tablet Take 500 mg by mouth daily.    . metoprolol succinate (TOPROL-XL) 25 MG 24 hr tablet Take 25 mg by mouth daily.    . Multiple Vitamins-Minerals (MULTIVITAMIN PO) Take 1 tablet by mouth daily.    . nortriptyline (PAMELOR) 50 MG capsule TAKE 1 CAPSULE (50 MG TOTAL) BY MOUTH AT BEDTIME. 90 capsule 3  . pantoprazole (PROTONIX) 40 MG tablet Take 40 mg by mouth daily.    . riboflavin (VITAMIN B-2) 100 MG TABS tablet Take 300 mg by mouth daily.    Marland Kitchen zolpidem (AMBIEN) 10 MG tablet Take 10 mg by mouth at bedtime.      No current facility-administered medications on file prior to visit.    ALLERGIES: No Known Allergies  FAMILY HISTORY: Family History  Problem Relation Age of Onset  . Diabetes Mother   . Arthritis Mother   . Heart disease Mother   . Vision loss Mother   . Arthritis Father   . Heart disease Father     SOCIAL HISTORY: Social History   Socioeconomic History  . Marital status: Divorced    Spouse name: Not on file  . Number of children: Not on file  . Years of education: Not on file  . Highest education level: Not on file  Occupational History  . Not on file  Tobacco Use  . Smoking status: Never Smoker  . Smokeless tobacco: Never Used  . Tobacco comment: current smoker had been entered in error  Vaping Use  . Vaping Use: Never used  Substance and Sexual Activity  . Alcohol use: Yes    Comment: glass wine /day  . Drug use: No  . Sexual activity: Not on file  Other Topics Concern  . Not on file  Social History Narrative   Lives alone in  2 story home; works in Architect - travels; R handed; Caffeine 2-3 cups in am    Social Determinants of Health   Financial Resource Strain:   . Difficulty of  Paying Living Expenses: Not on file  Food Insecurity:   . Worried About Charity fundraiser in the Last Year: Not on file  . Ran Out of Food in the Last Year: Not on file  Transportation Needs:   . Lack of Transportation (Medical): Not on file  . Lack of Transportation (Non-Medical): Not on file  Physical Activity:   . Days of Exercise per Week: Not on file  . Minutes of Exercise per Session: Not on file  Stress:   . Feeling of Stress : Not on file  Social Connections:   . Frequency of Communication with Friends and Family: Not on file  . Frequency of Social Gatherings with Friends and Family: Not on file  . Attends Religious Services: Not on file  . Active Member of Clubs or Organizations: Not on file  . Attends Archivist Meetings: Not on file  . Marital Status: Not on file  Intimate Partner Violence:   . Fear of Current or Ex-Partner: Not on file  . Emotionally Abused: Not on file  . Physically  Abused: Not on file  . Sexually Abused: Not on file    PHYSICAL EXAM: Blood pressure 131/84, pulse 92, height 5\' 11"  (1.803 m), weight 238 lb 3.2 oz (108 kg), SpO2 97 %. General: No acute distress.  Patient appears well-groomed.   Head:  Normocephalic/atraumatic Eyes:  Fundi examined but not visualized Neck: supple, no paraspinal tenderness, full range of motion Heart:  Regular rate and rhythm Lungs:  Clear to auscultation bilaterally Back: No paraspinal tenderness Neurological Exam: alert and oriented to person, place, and time. Attention span and concentration intact, recent and remote memory intact, fund of knowledge intact.  Speech fluent and not dysarthric, language intact.  CN II-XII intact. Bulk and tone normal, muscle strength 5/5 throughout.  Sensation to light touch, temperature and vibration intact.  Deep tendon reflexes 2+ throughout, toes downgoing.  Finger to nose and heel to shin testing intact.  Gait normal, Romberg negative.  IMPRESSION: Migraine with and  without aura  PLAN: 1.  Migraine preventative:  Nortriptyline 50mg  at bedtime 2.  Follow up one year  Metta Clines, DO  CC:  Sammuel Hines, PA-C

## 2019-10-21 ENCOUNTER — Other Ambulatory Visit: Payer: Self-pay

## 2019-10-21 ENCOUNTER — Ambulatory Visit: Payer: PPO | Admitting: Neurology

## 2019-10-21 ENCOUNTER — Encounter: Payer: Self-pay | Admitting: Neurology

## 2019-10-21 VITALS — BP 131/84 | HR 92 | Ht 71.0 in | Wt 238.2 lb

## 2019-10-21 DIAGNOSIS — G43109 Migraine with aura, not intractable, without status migrainosus: Secondary | ICD-10-CM | POA: Diagnosis not present

## 2019-10-21 MED ORDER — NORTRIPTYLINE HCL 50 MG PO CAPS: 50.0000 mg | ORAL_CAPSULE | Freq: Every day | ORAL | 3 refills | Status: DC

## 2019-10-21 NOTE — Patient Instructions (Signed)
Nortriptyline 50mg  at bedtime refilled Follow up in one year

## 2019-12-30 DIAGNOSIS — Z23 Encounter for immunization: Secondary | ICD-10-CM | POA: Diagnosis not present

## 2020-01-01 DIAGNOSIS — G4700 Insomnia, unspecified: Secondary | ICD-10-CM | POA: Diagnosis not present

## 2020-01-01 DIAGNOSIS — E782 Mixed hyperlipidemia: Secondary | ICD-10-CM | POA: Diagnosis not present

## 2020-01-01 DIAGNOSIS — G43009 Migraine without aura, not intractable, without status migrainosus: Secondary | ICD-10-CM | POA: Diagnosis not present

## 2020-01-01 DIAGNOSIS — I10 Essential (primary) hypertension: Secondary | ICD-10-CM | POA: Diagnosis not present

## 2020-01-01 DIAGNOSIS — K219 Gastro-esophageal reflux disease without esophagitis: Secondary | ICD-10-CM | POA: Diagnosis not present

## 2020-02-16 DIAGNOSIS — I10 Essential (primary) hypertension: Secondary | ICD-10-CM | POA: Diagnosis not present

## 2020-02-16 DIAGNOSIS — K219 Gastro-esophageal reflux disease without esophagitis: Secondary | ICD-10-CM | POA: Diagnosis not present

## 2020-02-16 DIAGNOSIS — G43009 Migraine without aura, not intractable, without status migrainosus: Secondary | ICD-10-CM | POA: Diagnosis not present

## 2020-02-16 DIAGNOSIS — G4700 Insomnia, unspecified: Secondary | ICD-10-CM | POA: Diagnosis not present

## 2020-02-16 DIAGNOSIS — E782 Mixed hyperlipidemia: Secondary | ICD-10-CM | POA: Diagnosis not present

## 2020-05-09 DIAGNOSIS — G43009 Migraine without aura, not intractable, without status migrainosus: Secondary | ICD-10-CM | POA: Diagnosis not present

## 2020-05-09 DIAGNOSIS — I10 Essential (primary) hypertension: Secondary | ICD-10-CM | POA: Diagnosis not present

## 2020-05-09 DIAGNOSIS — K219 Gastro-esophageal reflux disease without esophagitis: Secondary | ICD-10-CM | POA: Diagnosis not present

## 2020-05-09 DIAGNOSIS — G4700 Insomnia, unspecified: Secondary | ICD-10-CM | POA: Diagnosis not present

## 2020-05-09 DIAGNOSIS — E782 Mixed hyperlipidemia: Secondary | ICD-10-CM | POA: Diagnosis not present

## 2020-05-11 DIAGNOSIS — M109 Gout, unspecified: Secondary | ICD-10-CM | POA: Diagnosis not present

## 2020-05-11 DIAGNOSIS — Z0000 Encounter for general adult medical examination without abnormal findings: Secondary | ICD-10-CM | POA: Diagnosis not present

## 2020-05-11 DIAGNOSIS — Z23 Encounter for immunization: Secondary | ICD-10-CM | POA: Diagnosis not present

## 2020-05-11 DIAGNOSIS — G4700 Insomnia, unspecified: Secondary | ICD-10-CM | POA: Diagnosis not present

## 2020-05-11 DIAGNOSIS — G894 Chronic pain syndrome: Secondary | ICD-10-CM | POA: Diagnosis not present

## 2020-05-11 DIAGNOSIS — Z125 Encounter for screening for malignant neoplasm of prostate: Secondary | ICD-10-CM | POA: Diagnosis not present

## 2020-05-11 DIAGNOSIS — J302 Other seasonal allergic rhinitis: Secondary | ICD-10-CM | POA: Diagnosis not present

## 2020-05-11 DIAGNOSIS — I10 Essential (primary) hypertension: Secondary | ICD-10-CM | POA: Diagnosis not present

## 2020-05-11 DIAGNOSIS — K219 Gastro-esophageal reflux disease without esophagitis: Secondary | ICD-10-CM | POA: Diagnosis not present

## 2020-05-11 DIAGNOSIS — E782 Mixed hyperlipidemia: Secondary | ICD-10-CM | POA: Diagnosis not present

## 2020-05-18 DIAGNOSIS — K219 Gastro-esophageal reflux disease without esophagitis: Secondary | ICD-10-CM | POA: Diagnosis not present

## 2020-05-18 DIAGNOSIS — Z86010 Personal history of colonic polyps: Secondary | ICD-10-CM | POA: Diagnosis not present

## 2020-05-18 DIAGNOSIS — K5730 Diverticulosis of large intestine without perforation or abscess without bleeding: Secondary | ICD-10-CM | POA: Diagnosis not present

## 2020-06-02 DIAGNOSIS — D123 Benign neoplasm of transverse colon: Secondary | ICD-10-CM | POA: Diagnosis not present

## 2020-06-02 DIAGNOSIS — K635 Polyp of colon: Secondary | ICD-10-CM | POA: Diagnosis not present

## 2020-06-02 DIAGNOSIS — Z1211 Encounter for screening for malignant neoplasm of colon: Secondary | ICD-10-CM | POA: Diagnosis not present

## 2020-06-02 DIAGNOSIS — K6389 Other specified diseases of intestine: Secondary | ICD-10-CM | POA: Diagnosis not present

## 2020-06-02 DIAGNOSIS — K5730 Diverticulosis of large intestine without perforation or abscess without bleeding: Secondary | ICD-10-CM | POA: Diagnosis not present

## 2020-06-02 DIAGNOSIS — Z86010 Personal history of colonic polyps: Secondary | ICD-10-CM | POA: Diagnosis not present

## 2020-07-06 DIAGNOSIS — G43009 Migraine without aura, not intractable, without status migrainosus: Secondary | ICD-10-CM | POA: Diagnosis not present

## 2020-07-06 DIAGNOSIS — E782 Mixed hyperlipidemia: Secondary | ICD-10-CM | POA: Diagnosis not present

## 2020-07-06 DIAGNOSIS — I10 Essential (primary) hypertension: Secondary | ICD-10-CM | POA: Diagnosis not present

## 2020-07-06 DIAGNOSIS — K219 Gastro-esophageal reflux disease without esophagitis: Secondary | ICD-10-CM | POA: Diagnosis not present

## 2020-07-06 DIAGNOSIS — G4700 Insomnia, unspecified: Secondary | ICD-10-CM | POA: Diagnosis not present

## 2020-09-08 DIAGNOSIS — G43009 Migraine without aura, not intractable, without status migrainosus: Secondary | ICD-10-CM | POA: Diagnosis not present

## 2020-09-08 DIAGNOSIS — E782 Mixed hyperlipidemia: Secondary | ICD-10-CM | POA: Diagnosis not present

## 2020-09-08 DIAGNOSIS — G4700 Insomnia, unspecified: Secondary | ICD-10-CM | POA: Diagnosis not present

## 2020-09-08 DIAGNOSIS — K219 Gastro-esophageal reflux disease without esophagitis: Secondary | ICD-10-CM | POA: Diagnosis not present

## 2020-09-08 DIAGNOSIS — I10 Essential (primary) hypertension: Secondary | ICD-10-CM | POA: Diagnosis not present

## 2020-10-19 NOTE — Progress Notes (Signed)
NEUROLOGY FOLLOW UP OFFICE NOTE  Nathan Moon [id number]  Assessment/Plan:   Migraine with and without aura, without status migrainosus, not intractable  Migraine prevention:  nortriptyline '50mg'$  at bedtime Limit use of pain relievers to no more than 2 days out of week to prevent risk of rebound or medication-overuse headache. Keep headache diary Follow up one year   Subjective:  Nathan Moon is a 70 year old right-handed male with hypertension, hyperlipidemia, arthritis and history of gastric ulcer and kidney stone who follows up for migraine.   UPDATE: He had one headache this year.   Current NSAIDS:  no Current analgesics:  no Current triptans:  no Current ergotamine:  no Current anti-emetic:  none Current muscle relaxants:  none Current anti-anxiolytic:  none Current sleep aide:  none Current Antihypertensive medications:  Amlodipine, metoprolol Current Antidepressant medications:  Nortriptyline '50mg'$  Current Anticonvulsant medications:  none Current anti-CGRP:  none Current Vitamins/Herbal/Supplements:  MVI Current Antihistamines/Decongestants:  none Other therapy:  none   Caffeine:  2 cups of coffee in morning Diet:  Drinks plenty of water Exercise:  No Depression:  No; Anxiety:  No Other pain:  No Sleep hygiene:  Good with Ambien.   HISTORY: In late January 2017, he began experiencing headaches.  They are right temporal and sharp.They are associated with nausea, photophobia and phonophobia.  They would last 2 hours with Excedrin Migraine and would occur frequently (possibly daily).  At the same time, he began having dizzy spells as well.  If he gets up or changes head position too fast, then he would get spinning sensation and have to hold on to something.  It would last a few seconds.  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  He was unable to get words out.  He also noted that he couldn't understand or  process content of what people were saying to him.  .  There was no particular trigger.  He presented to the ED for further evaluation.  CBC and BMP were unremarkable.  Troponins were negative.  EKG showed sinus rhythm of 60 bpm.  CT of head showed no acute intracranial process.  After evaluation by neurology, it was determined that he likely has a complicated migraine and was treated with a headache cocktail of Compazine, Benadryl and Toradol.  Sed Rate was 6.  MRI of brain performed 04/13/15 was personally reviewed and was normal.  It resolved on its own.   Last complicated migraine:  November 2019 (presenting with headache, dizziness, trouble speaking and understanding others.  It lasted about a week.  Following this, he reported numbness and tingling on left side of face, intermittent off and on).  Nortriptyline was increased to '50mg'$  at bedtime.  Due to continued intermittent symptoms, he had MRI of brain on 02/25/18, which demonstrated normal brain but did show acute left maxillary sinusitis and right sphenoid sinusitis.  Advised to follow up with PCP regarding this (query if the left maxillary sinusitis may be cause of left facial numbness and tingling).  He was treated for sinusitis and numbness resolved.  No recurrent headaches/migraines (0 intensity, 0 duration, 0 frequency)   He does report remote history of migraines as a child and has a migraine very rarely.  PAST MEDICAL HISTORY: Past Medical History:  Diagnosis Date   Arthritis    Blood transfusion without reported diagnosis    Diverticulosis    Gastric ulcer    bleeding ulcer  GERD (gastroesophageal reflux disease)    Hernia, incisional    right   Hyperlipidemia    Hypertension    Pneumonia    hx of   Rash of neck    Wears glasses     MEDICATIONS: Current Outpatient Medications on File Prior to Visit  Medication Sig Dispense Refill   amLODipine (NORVASC) 5 MG tablet Take 5 mg by mouth daily.      atorvastatin (LIPITOR) 40 MG  tablet Take 40 mg by mouth daily.      celecoxib (CELEBREX) 200 MG capsule Take 200 mg by mouth daily. (Patient not taking: Reported on 10/21/2019)     Coenzyme Q10 (CO Q-10) 100 MG CAPS Take 400 mg by mouth daily. (Patient not taking: Reported on 10/21/2019)     fenofibrate 160 MG tablet Take 160 mg by mouth daily.     fluticasone (FLONASE) 50 MCG/ACT nasal spray Place 2 sprays into both nostrils daily as needed for allergies or rhinitis.     hydrochlorothiazide (HYDRODIURIL) 12.5 MG tablet Take 12.5 mg by mouth daily.     magnesium gluconate (MAGONATE) 500 MG tablet Take 500 mg by mouth daily. (Patient not taking: Reported on 10/21/2019)     metoprolol succinate (TOPROL-XL) 25 MG 24 hr tablet Take 25 mg by mouth daily.     Multiple Vitamins-Minerals (MULTIVITAMIN PO) Take 1 tablet by mouth daily.     nortriptyline (PAMELOR) 50 MG capsule Take 1 capsule (50 mg total) by mouth at bedtime. 90 capsule 3   pantoprazole (PROTONIX) 40 MG tablet Take 40 mg by mouth daily. (Patient not taking: Reported on 10/21/2019)     riboflavin (VITAMIN B-2) 100 MG TABS tablet Take 300 mg by mouth daily. (Patient not taking: Reported on 10/21/2019)     zolpidem (AMBIEN) 10 MG tablet Take 10 mg by mouth at bedtime.  (Patient not taking: Reported on 10/21/2019)     No current facility-administered medications on file prior to visit.    ALLERGIES: No Known Allergies  FAMILY HISTORY: Family History  Problem Relation Age of Onset   Diabetes Mother    Arthritis Mother    Heart disease Mother    Vision loss Mother    Arthritis Father    Heart disease Father       Objective:  Blood pressure 138/88, pulse 92, height '5\' 11"'$  (1.803 m), weight 240 lb 3.2 oz (109 kg), SpO2 98 %. General: No acute distress.  Patient appears well-groomed.   Head:  Normocephalic/atraumatic Eyes:  Fundi examined but not visualized Neck: supple, no paraspinal tenderness, full range of motion Heart:  Regular rate and rhythm Lungs:  Clear  to auscultation bilaterally Back: No paraspinal tenderness Neurological Exam: alert and oriented to person, place, and time.  Speech fluent and not dysarthric, language intact.  CN II-XII intact. Bulk and tone normal, muscle strength 5/5 throughout.  Sensation to light touch intact.  Deep tendon reflexes 2+ throughout, toes downgoing.  Finger to nose testing intact.  Gait normal, Romberg negative.   Metta Clines, DO  CC: Carolee Rota, NP

## 2020-10-20 ENCOUNTER — Ambulatory Visit: Payer: PPO | Admitting: Neurology

## 2020-10-20 ENCOUNTER — Encounter: Payer: Self-pay | Admitting: Neurology

## 2020-10-20 ENCOUNTER — Other Ambulatory Visit: Payer: Self-pay

## 2020-10-20 VITALS — BP 138/88 | HR 92 | Ht 71.0 in | Wt 240.2 lb

## 2020-10-20 DIAGNOSIS — G43109 Migraine with aura, not intractable, without status migrainosus: Secondary | ICD-10-CM

## 2020-10-20 MED ORDER — NORTRIPTYLINE HCL 50 MG PO CAPS: 50.0000 mg | ORAL_CAPSULE | Freq: Every day | ORAL | 3 refills | Status: AC

## 2020-10-20 NOTE — Patient Instructions (Signed)
Refilled nortriptyline

## 2021-05-27 DIAGNOSIS — M109 Gout, unspecified: Secondary | ICD-10-CM | POA: Diagnosis not present

## 2021-05-27 DIAGNOSIS — K219 Gastro-esophageal reflux disease without esophagitis: Secondary | ICD-10-CM | POA: Diagnosis not present

## 2021-05-27 DIAGNOSIS — I10 Essential (primary) hypertension: Secondary | ICD-10-CM | POA: Diagnosis not present

## 2021-05-27 DIAGNOSIS — E782 Mixed hyperlipidemia: Secondary | ICD-10-CM | POA: Diagnosis not present

## 2021-05-27 DIAGNOSIS — Z0000 Encounter for general adult medical examination without abnormal findings: Secondary | ICD-10-CM | POA: Diagnosis not present

## 2021-09-23 DIAGNOSIS — G43009 Migraine without aura, not intractable, without status migrainosus: Secondary | ICD-10-CM | POA: Diagnosis not present

## 2021-09-23 DIAGNOSIS — E782 Mixed hyperlipidemia: Secondary | ICD-10-CM | POA: Diagnosis not present

## 2021-09-23 DIAGNOSIS — K219 Gastro-esophageal reflux disease without esophagitis: Secondary | ICD-10-CM | POA: Diagnosis not present

## 2021-09-23 DIAGNOSIS — I10 Essential (primary) hypertension: Secondary | ICD-10-CM | POA: Diagnosis not present

## 2021-09-23 DIAGNOSIS — G4700 Insomnia, unspecified: Secondary | ICD-10-CM | POA: Diagnosis not present

## 2021-10-18 NOTE — Progress Notes (Deleted)
NEUROLOGY FOLLOW UP OFFICE NOTE  RAVINDRA BARANEK [id number]  Assessment/Plan:   Migraine with and without aura, without status migrainosus, not intractable  Migraine prevention:  nortriptyline '50mg'$  at bedtime Limit use of pain relievers to no more than 2 days out of week to prevent risk of rebound or medication-overuse headache. Keep headache diary Follow up one year   Subjective:  Shivansh Hardaway is a 71 year old right-handed male with hypertension, hyperlipidemia, arthritis and history of gastric ulcer and kidney stone who follows up for migraine.   UPDATE: He had one headache this year.   Current NSAIDS:  no Current analgesics:  no Current triptans:  no Current ergotamine:  no Current anti-emetic:  none Current muscle relaxants:  none Current anti-anxiolytic:  none Current sleep aide:  none Current Antihypertensive medications:  Amlodipine, metoprolol Current Antidepressant medications:  Nortriptyline '50mg'$  Current Anticonvulsant medications:  none Current anti-CGRP:  none Current Vitamins/Herbal/Supplements:  MVI Current Antihistamines/Decongestants:  none Other therapy:  none   Caffeine:  2 cups of coffee in morning Diet:  Drinks plenty of water Exercise:  No Depression:  No; Anxiety:  No Other pain:  No Sleep hygiene:  Good with Ambien.   HISTORY: In late January 2017, he began experiencing headaches.  They are right temporal and sharp.They are associated with nausea, photophobia and phonophobia.  They would last 2 hours with Excedrin Migraine and would occur frequently (possibly daily).  At the same time, he began having dizzy spells as well.  If he gets up or changes head position too fast, then he would get spinning sensation and have to hold on to something.  It would last a few seconds.  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  He was unable to get words out.  He also noted that he couldn't understand or  process content of what people were saying to him.  .  There was no particular trigger.  He presented to the ED for further evaluation.  CBC and BMP were unremarkable.  Troponins were negative.  EKG showed sinus rhythm of 60 bpm.  CT of head showed no acute intracranial process.  After evaluation by neurology, it was determined that he likely has a complicated migraine and was treated with a headache cocktail of Compazine, Benadryl and Toradol.  Sed Rate was 6.  MRI of brain performed 04/13/15 was personally reviewed and was normal.  It resolved on its own.   Last complicated migraine:  November 2019 (presenting with headache, dizziness, trouble speaking and understanding others.  It lasted about a week.  Following this, he reported numbness and tingling on left side of face, intermittent off and on).  Nortriptyline was increased to '50mg'$  at bedtime.  Due to continued intermittent symptoms, he had MRI of brain on 02/25/18, which demonstrated normal brain but did show acute left maxillary sinusitis and right sphenoid sinusitis.  Advised to follow up with PCP regarding this (query if the left maxillary sinusitis may be cause of left facial numbness and tingling).  He was treated for sinusitis and numbness resolved.  No recurrent headaches/migraines (0 intensity, 0 duration, 0 frequency)   He does report remote history of migraines as a child and has a migraine very rarely.  PAST MEDICAL HISTORY: Past Medical History:  Diagnosis Date   Arthritis    Blood transfusion without reported diagnosis    Diverticulosis    Gastric ulcer    bleeding ulcer  GERD (gastroesophageal reflux disease)    Hernia, incisional    right   Hyperlipidemia    Hypertension    Pneumonia    hx of   Rash of neck    Wears glasses     MEDICATIONS: Current Outpatient Medications on File Prior to Visit  Medication Sig Dispense Refill   amLODipine (NORVASC) 5 MG tablet Take 5 mg by mouth daily.      atorvastatin (LIPITOR) 40 MG  tablet Take 40 mg by mouth daily.      celecoxib (CELEBREX) 200 MG capsule Take 200 mg by mouth daily. (Patient not taking: No sig reported)     Coenzyme Q10 (CO Q-10) 100 MG CAPS Take 400 mg by mouth daily. (Patient not taking: No sig reported)     fenofibrate 160 MG tablet Take 160 mg by mouth daily.     fluticasone (FLONASE) 50 MCG/ACT nasal spray Place 2 sprays into both nostrils daily as needed for allergies or rhinitis.     hydrochlorothiazide (HYDRODIURIL) 12.5 MG tablet Take 12.5 mg by mouth daily.     magnesium gluconate (MAGONATE) 500 MG tablet Take 500 mg by mouth daily. (Patient not taking: No sig reported)     metoprolol succinate (TOPROL-XL) 25 MG 24 hr tablet Take 25 mg by mouth daily.     Multiple Vitamins-Minerals (MULTIVITAMIN PO) Take 1 tablet by mouth daily.     nortriptyline (PAMELOR) 50 MG capsule Take 1 capsule (50 mg total) by mouth at bedtime. 90 capsule 3   pantoprazole (PROTONIX) 40 MG tablet Take 40 mg by mouth daily. (Patient not taking: No sig reported)     riboflavin (VITAMIN B-2) 100 MG TABS tablet Take 300 mg by mouth daily. (Patient not taking: No sig reported)     zolpidem (AMBIEN) 10 MG tablet Take 10 mg by mouth at bedtime.  (Patient not taking: No sig reported)     No current facility-administered medications on file prior to visit.    ALLERGIES: No Known Allergies  FAMILY HISTORY: Family History  Problem Relation Age of Onset   Diabetes Mother    Arthritis Mother    Heart disease Mother    Vision loss Mother    Arthritis Father    Heart disease Father       Objective:  *** General: No acute distress.  Patient appears well-groomed.   Head:  Normocephalic/atraumatic Eyes:  Fundi examined but not visualized Neck: supple, no paraspinal tenderness, full range of motion Heart:  Regular rate and rhythm Lungs:  Clear to auscultation bilaterally Back: No paraspinal tenderness Neurological Exam: alert and oriented to person, place, and time.   Speech fluent and not dysarthric, language intact.  CN II-XII intact. Bulk and tone normal, muscle strength 5/5 throughout.  Sensation to light touch intact.  Deep tendon reflexes 2+ throughout, toes downgoing.  Finger to nose testing intact.  Gait normal, Romberg negative.   Metta Clines, DO  CC: Carolee Rota, NP

## 2021-10-20 ENCOUNTER — Ambulatory Visit: Payer: PPO | Admitting: Neurology

## 2021-10-20 ENCOUNTER — Encounter: Payer: Self-pay | Admitting: Neurology

## 2021-10-20 DIAGNOSIS — Z029 Encounter for administrative examinations, unspecified: Secondary | ICD-10-CM

## 2021-11-20 ENCOUNTER — Other Ambulatory Visit: Payer: Self-pay | Admitting: Neurology

## 2023-06-13 DIAGNOSIS — K219 Gastro-esophageal reflux disease without esophagitis: Secondary | ICD-10-CM | POA: Diagnosis not present

## 2023-06-13 DIAGNOSIS — Z0000 Encounter for general adult medical examination without abnormal findings: Secondary | ICD-10-CM | POA: Diagnosis not present

## 2023-06-13 DIAGNOSIS — Z23 Encounter for immunization: Secondary | ICD-10-CM | POA: Diagnosis not present

## 2023-06-13 DIAGNOSIS — E782 Mixed hyperlipidemia: Secondary | ICD-10-CM | POA: Diagnosis not present

## 2023-06-13 DIAGNOSIS — I10 Essential (primary) hypertension: Secondary | ICD-10-CM | POA: Diagnosis not present

## 2023-07-10 DIAGNOSIS — M25362 Other instability, left knee: Secondary | ICD-10-CM | POA: Diagnosis not present

## 2023-07-10 DIAGNOSIS — M25562 Pain in left knee: Secondary | ICD-10-CM | POA: Diagnosis not present

## 2023-12-06 DIAGNOSIS — E782 Mixed hyperlipidemia: Secondary | ICD-10-CM | POA: Diagnosis not present

## 2023-12-06 DIAGNOSIS — I10 Essential (primary) hypertension: Secondary | ICD-10-CM | POA: Diagnosis not present
